# Patient Record
Sex: Female | Born: 1965 | Race: White | Hispanic: No | Marital: Married | Smoking: Former smoker
Health system: Southern US, Community
[De-identification: ages and names within clinical notes are randomized; demographics above are authoritative.]

## PROBLEM LIST (undated history)

## (undated) DIAGNOSIS — M5126 Other intervertebral disc displacement, lumbar region: Secondary | ICD-10-CM

## (undated) DIAGNOSIS — M5136 Other intervertebral disc degeneration, lumbar region: Secondary | ICD-10-CM

## (undated) DIAGNOSIS — S022XXA Fracture of nasal bones, initial encounter for closed fracture: Secondary | ICD-10-CM

## (undated) DIAGNOSIS — M545 Low back pain, unspecified: Secondary | ICD-10-CM

## (undated) DIAGNOSIS — Z974 Presence of external hearing-aid: Secondary | ICD-10-CM

## (undated) DIAGNOSIS — M26629 Arthralgia of temporomandibular joint, unspecified side: Secondary | ICD-10-CM

## (undated) DIAGNOSIS — G8929 Other chronic pain: Secondary | ICD-10-CM

## (undated) DIAGNOSIS — K5090 Crohn's disease, unspecified, without complications: Secondary | ICD-10-CM

## (undated) DIAGNOSIS — J302 Other seasonal allergic rhinitis: Secondary | ICD-10-CM

## (undated) DIAGNOSIS — M51369 Other intervertebral disc degeneration, lumbar region without mention of lumbar back pain or lower extremity pain: Secondary | ICD-10-CM

## (undated) DIAGNOSIS — H9190 Unspecified hearing loss, unspecified ear: Secondary | ICD-10-CM

## (undated) DIAGNOSIS — G44209 Tension-type headache, unspecified, not intractable: Secondary | ICD-10-CM

## (undated) DIAGNOSIS — Z98811 Dental restoration status: Secondary | ICD-10-CM

## (undated) HISTORY — PX: FINGER SURGERY: SHX640

## (undated) HISTORY — PX: STAPEDECTOMY: SHX2435

## (undated) HISTORY — PX: KNEE ARTHROSCOPY: SHX127

## (undated) HISTORY — PX: BREAST EXCISIONAL BIOPSY: SUR124

## (undated) HISTORY — PX: APPENDECTOMY: SHX54

## (undated) HISTORY — PX: ORIF FINGER FRACTURE: SHX2122

## (undated) HISTORY — PX: TONSILLECTOMY: SUR1361

## (undated) HISTORY — PX: BUNIONECTOMY: SHX129

---

## 1996-07-28 HISTORY — PX: BREAST ENHANCEMENT SURGERY: SHX7

## 2004-02-09 ENCOUNTER — Other Ambulatory Visit: Admission: RE | Admit: 2004-02-09 | Discharge: 2004-02-09 | Payer: Self-pay | Admitting: Obstetrics and Gynecology

## 2005-01-06 ENCOUNTER — Ambulatory Visit: Payer: Self-pay | Admitting: *Deleted

## 2005-01-06 ENCOUNTER — Ambulatory Visit: Payer: Self-pay | Admitting: Nurse Practitioner

## 2005-01-22 ENCOUNTER — Ambulatory Visit: Payer: Self-pay | Admitting: Nurse Practitioner

## 2005-02-12 ENCOUNTER — Ambulatory Visit: Payer: Self-pay | Admitting: Nurse Practitioner

## 2005-03-10 ENCOUNTER — Ambulatory Visit: Payer: Self-pay | Admitting: Nurse Practitioner

## 2005-04-04 ENCOUNTER — Ambulatory Visit: Payer: Self-pay | Admitting: Nurse Practitioner

## 2005-04-25 ENCOUNTER — Encounter: Admission: RE | Admit: 2005-04-25 | Discharge: 2005-07-24 | Payer: Self-pay | Admitting: Anesthesiology

## 2005-04-29 ENCOUNTER — Ambulatory Visit: Payer: Self-pay | Admitting: Anesthesiology

## 2005-04-29 ENCOUNTER — Ambulatory Visit (HOSPITAL_COMMUNITY): Admission: RE | Admit: 2005-04-29 | Discharge: 2005-04-29 | Payer: Self-pay | Admitting: Anesthesiology

## 2005-05-13 ENCOUNTER — Emergency Department (HOSPITAL_COMMUNITY): Admission: EM | Admit: 2005-05-13 | Discharge: 2005-05-13 | Payer: Self-pay | Admitting: Emergency Medicine

## 2005-05-18 ENCOUNTER — Emergency Department (HOSPITAL_COMMUNITY): Admission: EM | Admit: 2005-05-18 | Discharge: 2005-05-18 | Payer: Self-pay | Admitting: Emergency Medicine

## 2005-05-22 ENCOUNTER — Emergency Department (HOSPITAL_COMMUNITY): Admission: EM | Admit: 2005-05-22 | Discharge: 2005-05-22 | Payer: Self-pay | Admitting: Family Medicine

## 2005-05-23 ENCOUNTER — Emergency Department (HOSPITAL_COMMUNITY): Admission: EM | Admit: 2005-05-23 | Discharge: 2005-05-23 | Payer: Self-pay | Admitting: Family Medicine

## 2005-06-12 ENCOUNTER — Emergency Department (HOSPITAL_COMMUNITY): Admission: EM | Admit: 2005-06-12 | Discharge: 2005-06-12 | Payer: Self-pay | Admitting: Family Medicine

## 2005-06-24 ENCOUNTER — Encounter
Admission: RE | Admit: 2005-06-24 | Discharge: 2005-09-22 | Payer: Self-pay | Admitting: Physical Medicine and Rehabilitation

## 2005-06-24 ENCOUNTER — Ambulatory Visit: Payer: Self-pay | Admitting: Physical Medicine and Rehabilitation

## 2005-06-26 ENCOUNTER — Emergency Department (HOSPITAL_COMMUNITY): Admission: EM | Admit: 2005-06-26 | Discharge: 2005-06-26 | Payer: Self-pay | Admitting: Family Medicine

## 2005-07-22 ENCOUNTER — Emergency Department (HOSPITAL_COMMUNITY): Admission: EM | Admit: 2005-07-22 | Discharge: 2005-07-22 | Payer: Self-pay | Admitting: Family Medicine

## 2005-08-18 ENCOUNTER — Other Ambulatory Visit: Admission: RE | Admit: 2005-08-18 | Discharge: 2005-08-18 | Payer: Self-pay | Admitting: Gynecology

## 2005-08-19 ENCOUNTER — Ambulatory Visit: Payer: Self-pay | Admitting: Physical Medicine and Rehabilitation

## 2005-10-14 ENCOUNTER — Encounter
Admission: RE | Admit: 2005-10-14 | Discharge: 2006-01-12 | Payer: Self-pay | Admitting: Physical Medicine and Rehabilitation

## 2005-10-14 ENCOUNTER — Ambulatory Visit: Payer: Self-pay | Admitting: Physical Medicine and Rehabilitation

## 2005-10-23 ENCOUNTER — Encounter (INDEPENDENT_AMBULATORY_CARE_PROVIDER_SITE_OTHER): Payer: Self-pay | Admitting: Specialist

## 2005-10-23 ENCOUNTER — Inpatient Hospital Stay (HOSPITAL_COMMUNITY): Admission: RE | Admit: 2005-10-23 | Discharge: 2005-10-26 | Payer: Self-pay | Admitting: Obstetrics and Gynecology

## 2005-10-23 HISTORY — PX: ABDOMINAL HYSTERECTOMY: SHX81

## 2005-11-02 ENCOUNTER — Inpatient Hospital Stay (HOSPITAL_COMMUNITY): Admission: EM | Admit: 2005-11-02 | Discharge: 2005-11-06 | Payer: Self-pay | Admitting: Emergency Medicine

## 2005-11-06 ENCOUNTER — Encounter: Payer: Self-pay | Admitting: Obstetrics and Gynecology

## 2005-11-18 ENCOUNTER — Ambulatory Visit: Payer: Self-pay | Admitting: Physical Medicine and Rehabilitation

## 2006-02-15 ENCOUNTER — Emergency Department (HOSPITAL_COMMUNITY): Admission: EM | Admit: 2006-02-15 | Discharge: 2006-02-15 | Payer: Self-pay | Admitting: Family Medicine

## 2006-03-04 ENCOUNTER — Ambulatory Visit (HOSPITAL_COMMUNITY): Admission: RE | Admit: 2006-03-04 | Discharge: 2006-03-04 | Payer: Self-pay | Admitting: Family Medicine

## 2006-05-01 ENCOUNTER — Encounter: Admission: RE | Admit: 2006-05-01 | Discharge: 2006-05-01 | Payer: Self-pay | Admitting: Family Medicine

## 2006-07-09 ENCOUNTER — Ambulatory Visit (HOSPITAL_COMMUNITY): Admission: RE | Admit: 2006-07-09 | Discharge: 2006-07-09 | Payer: Self-pay | Admitting: Family Medicine

## 2006-07-13 ENCOUNTER — Encounter: Admission: RE | Admit: 2006-07-13 | Discharge: 2006-07-13 | Payer: Self-pay | Admitting: Gastroenterology

## 2007-08-10 ENCOUNTER — Emergency Department (HOSPITAL_COMMUNITY): Admission: EM | Admit: 2007-08-10 | Discharge: 2007-08-10 | Payer: Self-pay | Admitting: Emergency Medicine

## 2008-11-30 ENCOUNTER — Ambulatory Visit (HOSPITAL_COMMUNITY): Admission: RE | Admit: 2008-11-30 | Discharge: 2008-11-30 | Payer: Self-pay | Admitting: Family Medicine

## 2010-08-17 ENCOUNTER — Encounter: Payer: Self-pay | Admitting: Anesthesiology

## 2010-08-18 ENCOUNTER — Encounter: Payer: Self-pay | Admitting: Internal Medicine

## 2010-08-18 ENCOUNTER — Encounter: Payer: Self-pay | Admitting: Family Medicine

## 2010-08-19 ENCOUNTER — Encounter: Payer: Self-pay | Admitting: Family Medicine
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
## 2010-12-13 NOTE — Discharge Summary (Signed)
[name], [name]              ACCOUNT NO.:  [id number]   MEDICAL RECORD NO.:  [id number]          PATIENT TYPE:  INP   LOCATION:  9309                          FACILITY:  [facility]   PHYSICIAN:  Bobbye Charleston, M.D. DATE OF BIRTH:  1966/01/12   DATE OF ADMISSION:  10/23/2005  DATE OF DISCHARGE:  10/26/2005                                 DISCHARGE SUMMARY   ADMITTING DIAGNOSIS:  Severe menometrorrhagia and dysmenorrhea.   DISCHARGE DIAGNOSES:  1.  Severe menometrorrhagia and dysmenorrhea.  2.  Endometriosis.   PERTINENT PROCEDURES PERFORMED:  Total abdominal hysterectomy.   HISTORY AND PHYSICAL:  Please refer to dictated history and physical on  chart but briefly, this is a 45 year old with progressively heavier periods,  menometrorrhagia, and severe dysmenorrhea.   HOSPITAL COURSE:  The patient was taken to the operating room on October 23, 2005, for the above-named procedures and underwent them without  complication.  On postoperative day #3 the patient was eating, ambulating  and voiding, and was discharged to home with the following:  Diet was high  fiber and high water.  Activity was walk with assistance.  No driving for 2  weeks, no lifting for 6 weeks.  Increase activity slowly.  No sexual  activity for 6 weeks.  The patient may shower but no baths, and may walk up  steps.  Medications were Percocet 5 mg one p.o. q.4-6h. p.r.n. pain.  The  patient was to return to Dr. Deatra Ina on the following Monday for staples out  and return to Dr. Philis Pique in 2 weeks for incision check.      Bobbye Charleston, M.D.  Electronically Signed     MH/MEDQ  D:  12/04/2005  T:  12/05/2005  Job:  [id number]

## 2010-12-13 NOTE — Assessment & Plan Note (Signed)
[name] is a 45 year old female seen last by me on November 29.   She is being seen in our pain and rehabilitative clinic for multiple chronic  pain complaints including cervicalgia, thoracic back pain, and bilateral hip  pain.   She describes her pain as about an 8 on a scale to 10, described as  constant, sharp, and aching in nature, burning, worse at the end of the day.  She works as a Charity fundraiser.  Her sleep has been poor. Her pain improves  somewhat with rest, heat, and medication injections.  Gets good relief with  the current medications she is on. At this time, she takes oxycodone 15 mg a  couple of times a day for a total of about 100 a month.  She also takes  Klonopin.   She continues to work 40 to 50 hours a week.  She is independent with all of  her self care.  No new problems.  No problems with neurology, psychiatric  health and history area. No new problems on the Review of Systems and Health  and History form.   Past medical and family history unchanged since last visit.  She currently  lives with her mother and grandmother.   PHYSICAL EXAMINATION:  VITAL SIGNS:  Blood pressure 117/78, pulse 76,  respirations 16, 99% saturation on room air.  GENERAL: Well-developed, well-nourished female who appears her stated age.  She is oriented x3. Affect is bright and alert.  She is cooperative and  pleasant.  She stands after being seated without difficulty. Gait is normal.  She has significant limitations in cervical range of motion, not more than  10 to 15 degrees with rotation right, left, flexion or extension.  Seated  reflexes are symmetrical and intact in upper and lower extremities.  Motor  strength is good throughout, no focal weakness.  Normal tone is noted.  She  has exquisite tenderness along the iliotibial band bilaterally and in the  gluteus medius musculature.   IMPRESSION:  1.  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  2.  Lumbago secondary  to lumbar facet arthropathy.  3.  Bilateral trochanteric bursitis.  4.  Overall deconditioned with 20-pound weight gain.  5.  Mild anxiety.  6.  History of insomnia.   PLAN:  I spent approximately 30 minutes with her again assessing treatment  options.  We will go ahead and refill her medications.  She has not had any  adverse reactions from the medications, not constipated, not oversedated.  She does not use when she is driving.  Would like to get her a brace to help  her with posture, especially during activities.  Would also consider  possibly getting her involved in a physical therapy program, assistance on  lower extremity flexibility, strengthening, soft tissue pain management  techniques.  Will pursue trigger point on the next visit.  May also consider  possible injection of trochanters at some point in the future.  Do recommend  TENS unit for her.  She will make a list of medications she has been on in  the past and bring to me at the next visit.  I will see her back in a month.           ______________________________  Franchot Gallo, M.D.     DMK/MedQ  D:  08/20/2005 13:22:03  T:  08/20/2005 16:07:54  Job #:  [id number]

## 2010-12-13 NOTE — Assessment & Plan Note (Signed)
HISTORY OF PRESENT ILLNESS:  [name] evaluate her Health and History form and 14-point review  of systems.  Referred to [place] by __________ score.  I reviewed available  records.  We called for records, chaperoned visit.  We spent a considerable  period of time discussing treatment, limitations and options.  She relates  in a salted school, as well as, progressive issues with previous work in  life that has resulted in paracervical and para lumbar pain, most notably  paracervical.  Her set pain is 7/10 subjective scale, dull, aching and  throbbing.  No temporal relation of the day.  It interferes with most  activities.  Sometimes sharp and burning, extending from levator scapular.  Her para lumbar pain extends to the infragluteal region with decreased  endurance.  Sometimes tingling spasms, dizziness, and she has some  depression but states no wish to harm self or others.  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  We do not have recent __________ or MRI's, but older x-rays and  MRI's suggest that H&P, cervical spine and degenerative components of the  lower lumbar segments.   PAST SURGICAL HISTORY:  1.  Lumpectomy.  2.  Appendectomy.   SOCIAL HISTORY:  She is divorced.  She lives alone.  She is a smoker of one  half pack per day.  She denies any alcohol or drug abuse.   FAMILY HISTORY:  Remarkable for heart disease, diabetes and alcohol abuse.   MEDICATIONS:  She has been trialed on a number of medications, and I  reviewed these extensively with her, inclusive of Soma, Xanax, Dilaudid,  Buprenex and OxyContin.  She states that at this time she only currently  will have Dilaudid, Xanax and Soma available to her, and Xanax is at p.r.n.   Otherwise, 14-point review of systems and Health and History form review.  Review of systems, family and social history, otherwise,  noncontributory to  pain.   PHYSICAL EXAMINATION:  GENERAL APPEARANCE:  A pleasant female sitting  comfortably in bed.  Gait, affect, appearance is normal and oriented x3.  HEENT:  Unremarkable.  CHEST:  Clear to auscultation and percussion.  HEART:  Regular rate and rhythm without murmurs, rubs or gallops.  ABDOMEN:  Soft, nontender, benign, no hepatosplenomegaly.  Some modest  epigastric discomfort.  Query as to questioning.  Occasionally she has pain  here.  She has discussed this with primary care.  NEUROLOGICAL:  Suprascapular and levator scapular pain, extension of para lumbar position.  Decreased range of motion of the cervical and lumbar spine with side bending  positive, pain with flexion/extension cervical lumbar spine.  She has  straight leg lift impaired but EHL is fine right and left.  She is non-  distractible, Waddell's nonpositive forthright exam.   IMPRESSION:  Cervical degenerative spine disease, degenerative spine disease  lumbar spine, poor well-health characteristics, myofascial pain syndrome.   PLAN:  1.  Refresh MRI, cervical spine.  This is most problematic.  Consider      lumbar.  2.  Consider surgical assessment as I believe she probably has a surgical      neck.  We may want to try it from a minimally invasive approach      interventional procedures.  3.  I reviewed the opioid consent and Patient Care Agreement and policy and      procedure.  She  is working within the system.  Appears forthright.  Does      have legitimate medical need, and I will go ahead and prescribe      Roxycodone for symptom control.  Xanax is to be used very sparingly, and      I am going to discontinue soma.  We want to avoid a habituating profile      and treat cause as opposed to symptoms.  We will do what we can to keep      her comfortable as we enhance our database.  She will let us know if      there are any further problems.   I will see her back in two weeks.  Extensive  consultation is overall  directive care approach.  Discharge instructions given.  No barrier to  communication.           ______________________________  Rosann Auerbach, MD     HH/MedQ  D:  04/29/2005 10:36:05  T:  04/29/2005 11:27:04  Job #:  [id number]   cc:   Simeon Craft, M.D.

## 2010-12-13 NOTE — H&P (Signed)
NAMEVERLON, CARCIONE              ACCOUNT NO.:  [id number]   MEDICAL RECORD NO.:  [id number]          PATIENT TYPE:  AMB   LOCATION:  [place]                           FACILITY:  [facility]   PHYSICIAN:  Bobbye Charleston, M.D. DATE OF BIRTH:  [birth year]   DATE OF ADMISSION:  10/23/2005  DATE OF DISCHARGE:                                HISTORY & PHYSICAL   CHIEF COMPLAINT:  This is a 45 year old with progressively heavier periods,  menometrorrhagia and severe dysmenorrhea.   HISTORY OF PRESENT ILLNESS:  Ms. Elgie Congo is a 45 year old G0 who has had  periods that have been heavy and painful all her life.  However, in the last  several years her periods have become progressively worse.  She has had 3  periods per month on average.  She has severe cramps that debilitate her and  make it unable for her to go to work.  She has extremely heavy periods,  using multiple tampons and pads.  She is oftentimes in bed for 2 days  because of the periods, and has had no relief.   The patient had undergone multiple laparoscopies and had been given a  diagnosis of endometriosis in the past.  She had also attempted fertility  treatment in the past, with no success.  The patient also complains of  severe headaches during her periods.  She has been suffering for several  years, but has not had insurance and was unable to present with her  symptoms.   The patient complains of some vaginal dryness with sexual activity, but no  pain or bleeding with intercourse; no leaking of urine and no troubles with  her bowels.   PAST MEDICAL HISTORY:  The patient has a history of asthma, and is on a  metered dose inhaler (using 4x day albuterol).  I asked her to restart on  the Advair 250/50 twice a day, as well as the metered dose inhaler for  increased control of her asthma.  She has no history of diabetes, thyroid,  seizures or heart problems.   PAST SURGICAL HISTORY:  The patient has had an appendectomy.  She has had  multiple laparoscopies for dysmenorrhea and for infertility.  She has had  multiple D&Cs.   PAST GYNECOLOGIC HISTORY:  The patient does not have a history of sexually  transmitted diseases, per se.  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   PAST OBSTETRICAL HISTORY:  None.   MEDICATIONS:  The patient is no longer taking her Neurontin.  She is taking  her metered dose inhaler 4x day, and has been taking [facility] secondary to back pain.  However, she has been taking  Vicodin or Percocet in the meantime as well.  She requests Percocet  postoperatively.   ALLERGIES:  SULFA MEDICATIONS.   TOBACCO:  The patient smokes a 1/2 pack per day.   PHYSICAL EXAMINATION:  VITAL SIGNS:  Blood pressure 100/58.  GENERAL:  Reveals a well nourished female, in some distress secondary to  pain.  NECK:  Without thyromegaly or lymphadenopathy.  HEART:  Regular rate and rhythm.  PULMONARY:  Clear to auscultation.  GASTROINTESTINAL:  Her abdomen is soft, nontender and nondistended.  RECTUM:  Without masses or hemorrhoids.  BREASTS:  Symmetrical.  PELVIC:  Revealed normal external genitalia, except for a mole with  irregular borders (which was removed and found to be benign).  Normal vault  and vagina.  Cervix is round and mobile.  Uterus was less than 9 week size.  Adnexa was without masses.  She had generalized tenderness in all quadrants  of palpation (the adnexa, the uterus itself, the cul-de-sac, the anterior  etc.)  There were no other masses felt though.  EXTREMITIES:  Benign.  SKIN:  Benign.   DIAGNOSTIC TESTING:  Ultrasound revealed a 7.5 x 4.2 x 6 cm uterus, with a  1.2 cm endometrial stripe.  An endometrial biopsy was performed, which  showed no hyperplasia or malignancy.   ASSESSMENT:  This is a 45 year old with progressively worsening  dysmenorrhea, menometrorrhagia and a history of endometriosis; as well as a  history of PID.  She has had  multiple laparoscopies and has had no children.   The patient understands that the hysterectomy will make her infertile, and  she understands and accepts this.  The patient strongly desires definitive  therapy, and she will undergo a total abdominal hysterectomy with possible  bilateral salpingo-oophorectomy.  All the risks, benefits and alternatives  have been discussed with the patient, and the patient agrees to proceed.  The patient is to have preoperative antibiotics and SCDs during the surgery.  Her ovaries and tubes will be removed only if there is disease present;  otherwise, she wishes to retain them.      Bobbye Charleston, M.D.  Electronically Signed     MH/MEDQ  D:  10/23/2005  T:  10/23/2005  Job:  [id number]

## 2010-12-13 NOTE — Op Note (Signed)
[name], [name]              ACCOUNT NO.:  [id number]   MEDICAL RECORD NO.:  [id number]          PATIENT TYPE:  INP   LOCATION:  9309                          FACILITY:  [facility]   PHYSICIAN:  Bobbye Charleston, M.D. DATE OF BIRTH:  Jan 27, 1966   DATE OF PROCEDURE:  10/23/2005  DATE OF DISCHARGE:                                 OPERATIVE REPORT   PREOPERATIVE DIAGNOSIS:  Severe dysmenorrhea and menometrorrhagia.   POSTOPERATIVE DIAGNOSIS:  Severe dysmenorrhea and menometrorrhagia. Plus  endometriosis.   PROCEDURE:  Total abdominal hysterectomy.   SURGEON:  Bobbye Charleston, M.D.   ASSISTANT:  Barbaraann Rondo, M.D.   ANESTHESIA:  General.   SPECIMENS:  Uterus and cervix.   ESTIMATED BLOOD LOSS:  Less than 100 mL.   IV FLUIDS:  1100 mL.   URINE OUTPUT:  50 mL.   COMPLICATIONS:  Were none.   FINDINGS:  Generally healthy looking pelvis, normal appearing ovaries, tubes  and uterus.  There was evidence of small amounts of endometriosis plaques  less then 0.5 cm in size. One was on the uterus near the cardinal ligament  which was removed during the operation. The second was on the right ovary  which was cauterized.  The third was in the cul-de-sac and this was also  cauterized.  There was no appendix present and there were no abnormalities  otherwise.  Counts were correct x3.   MEDICATIONS:  Ancef.   TECHNIQUE:  After adequate general anesthesia was achieved, the patient  prepped, draped in sterile fashion in dorsal supine position.  A  Pfannenstiel skin was incision was made after the bladder had been emptied  with the Foley catheter.  This carried to the fascia with the Bovie cautery  and then incised in midline with the Bovie cautery.  The fascia was incised  in transverse curvilinear manner with the Mayo scissors and the muscles  reflected superiorly and inferiorly from the fascia.  The rectus muscles  were split in the midline and bowel free portion of peritoneum  entered into  carefully with sharp dissection with the Metzenbaum scissors.  The incision  was extended in superior and inferior manner with good visualization of  bowel and bladder.   The bowel was packed away with wet laps and O'Connor-O'Sullivan retractor  placed with attention to protecting the psoas muscles.  The uterus was  identified and the above findings noted.  Uterus was grasped with pair of  Kelly clamps and each round ligament was secured with a suture transfixion  stitch of 0 Vicryl and then divided with the Bovie cautery.  The  vesicouterine fascia was incised with Bovie cautery and carried in  transverse curvilinear manner.  The bladder flap was created with blunt and  sharp dissection with the Bovie cautery and Metzenbaum scissors until the  bladder was retracted out of the field.  The avascular portion of the  posterior leaf of broad ligament entered into with the Bovie cautery and  Heaneys were placed over the uterine ovarian ligaments.  Each pedicle was  incised with Mayo scissors and secured with a freehand tie of  0 Vicryl  followed by a stitch of 0 Vicryl.   The uterine arteries were skeletonized and a pair of Heaneys were placed  bilaterally on either side of the cervix.  Each pedicle was incised with  Mayo scissors and secured with a stitch of 0 Vicryl.  The cardinal ligament  was then divided using alternating successive bites of the straight Heaney  clamp.  Each pedicle was incised with scalpel and then secured with a stitch  of 0 Vicryl.  At the level of the reflection of the vagina and the cervix  pair of curved Heaneys were placed and each pedicle was incised with Mayo  scissors and the angle of the cuff then secured with a Heaney stitch of 0  Vicryl.  The uterus and cervix were then amputated with the Jorgenson  scissors and the uterus handed off the field.   The vaginal cuff was then closed with two additional figure-of-eight  stitches of 0 Vicryl.   Irrigation was performed and all hemostasis was  achieved with an additional stitch of the right uterosacral.  The ovaries  and tubes were carefully inspected and found to have no other endometriosis  except the one spot on the right-hand side.  This was cauterized with the  Bovie cautery.  The small amount of endometriosis in the cul-de-sac was then  taken care of with Bovie cautery as well.  This lesion was between the  uterosacrals and the vaginal side of the cul-de-sac.  The appendix was  looked for and it was surgically absent.  The bowel otherwise appeared  normal and so did all of the other surfaces of the pelvis.  All instruments  were then withdrawn from the abdomen after the ureters had been identified  bilaterally coursing without problems.  They were well out of the field of  dissection.  The peritoneum was then closed with a running stitch of 2-0  Vicryl.  The fascia was closed with a running stitch of 0 PDS looped.  The  subcutaneous tissue was rendered hemostatic with Bovie cautery.  The skin  was closed with staples.      Bobbye Charleston, M.D.  Electronically Signed     MH/MEDQ  D:  10/23/2005  T:  10/24/2005  Job:  [id number]

## 2010-12-13 NOTE — Discharge Summary (Signed)
[name], Snyder              ACCOUNT NO.:  [id number]   MEDICAL RECORD NO.:  [id number]          PATIENT TYPE:  INP   LOCATION:  9303                          FACILITY:  [facility]   PHYSICIAN:  Bobbye Charleston, M.D. DATE OF BIRTH:  Feb 15, 1966   DATE OF ADMISSION:  11/04/2005  DATE OF DISCHARGE:  11/06/2005                                 DISCHARGE SUMMARY   ADMISSION DIAGNOSES:  1.  Postoperative presumed cuff cellulitis with the possibility of pelvic      abscess.  2.  Superficial wound separation and seroma.  3.  Abdominal pain and fever.   HISTORY AND PHYSICAL:  Please refer to written history and physical on the  chart, but briefly this is a 45 year old, G0, status post total abdominal  hysterectomy on October 23, 2005, for severe dysmenorrhea, menorrhagia, and  endometriosis. The patient presented with abdominal pain, fever of 101, and  diarrhea. She had noted a swelling of incision, but complained of nausea and  vomiting. She has been taking Darvocet for pain which was not helpful, had  not been eating well, and had had some anorexia.   PAST MEDICAL/SURGICAL HISTORY:  As per her admission history and physical  for her hysterectomy.   MEDICATIONS:  Ibuprofen and Klonopin as well as Darvocet.   ALLERGIES:  SULFA and IODINE.   PHYSICAL EXAMINATION:  The patient's temperature on admission was 99, blood  pressure 124/75. The abdomen was significant for hypoactive bowel sounds,  but soft and without guarding. The Pfannenstiel incision had fluctuant  tenderness in a 3-4 cm area at the right apex. Pelvic exam revealed normal  external genitalia. Speculum revealed greenish foul drainage from the  vaginal apex. Bimanual exam revealed the cuff to be separated with palpable  3 cm opening and tender. There were no obvious masses.   Laboratory results revealed white blood cell count of 12.8 and 12.5 on  admission with a left shift, H&H 13/40. Other labs were otherwise normal  including a BUN and creatinine of 5 and 0.6.   A CT of the abdomen was obtained which revealed a 10-mm cyst in the right  kidney and otherwise unremarkable abdomen. Pelvic CT revealed residual area  in the pelvis greater than expected for 10 days post surgery. Room enhancing  fluid collection in the rectus sheath and subcutaneous fluid collection of  the right midline measured 2 x 3.2 x 4 cm with air, also concerning for  infection. There was also apparently free fluid within the pelvis and a more  focal room enhancing fluid collection on the right which may have  represented a large ovarian cyst.   ASSESSMENT:  Dr. Lucia Gaskins of general surgery was called to assist and consult  at bedtime and performed a small incision and drainage at the abdominal  incision finding a hematoma and seroma, which was drained and packed with  wet-to-dry dressings. Anaerobic and aerobic cultures were performed. Pelvic  fluid collection was deemed to be stable and the patient was started on two  antibiotics. The plan at that time was to admit for IV antibiotics,  gentamycin  and clindamycin; admit for pain control with Dilaudid PCA;  dressing change three times a day; CBC with diff in the a.m. follow up  cultures of wound, gonorrhea, chlamydia, and wet prep, and Dr. Lucia Gaskins  followed the patient while she was in the hospital. On hospital day #2, the  patient seemed to be responding to the antibiotics and felt better on the  Dilaudid PCA. Her appetite had returned. We continued the gentamycin and  clindamycin times 48 hours afebrile. The patient was moved from [facility] where she had been admitted through the ER to [facility] at  this point. Her white blood cell count was 12.4, but did not increase. That  evening the patient complained of increased pain with dressing change, but  had no signs of worsening of status. On hospital day #3, white blood cell  count was 10.1, the patient was afebrile  almost 48 hours. Consulted wound  care at that point to help with dressing changes. Gonorrhea and Chlamydia  had been negative. RPR had been negative and the wound cultures were still  pending. The patient again had trouble with her dressing changes, but other  than that remained stable.  On hospital day #4, the patient's IV antibiotics  were changed to Keflex q.i.d. and she was weaned from the Dilaudid PCA to  Percocet 5 mg q.4h. and Motrin q.8h.  By hospital day #5, pain control was  still a major issue, especially with the dressing changes. The patient had  been on chronic narcotics before, even before her abdominal hysterectomy.  She is currently having trouble with tolerance issues. The patient underwent  a second CT of the abdomen and pelvis showed essentially resolved rectus  sheath collection, but was worrisome for slightly increased fluid collection  of 5.3 x 5.7 x 5.6.  The ovaries could not be identified on the CT. A pelvic  ultrasound was done which showed a 5.8 x 5.1 x 4.4 simple cyst in the right  hemipelvis which was probably secondary to an ovarian cyst and ruled out an  abscess or acute hematoma. The patient remained to be afebrile and on  hospital day #5 was discharged with the following.   MEDICATIONS:  1.  Roxicodone 50 mg q.6h.  2.  Keflex 250 mg q.6h. times seven days.  3.  Colace 100 mg over-the-counter.  4.  Motrin over-the-counter.   WOUND CARE:  [facility] dressing change one time per day.   ACTIVITY:  No driving for two weeks, no lifting for six weeks, increase  activity slowly. No sexual activity for six weeks. May shower, but not bathe  and may walk up steps. The patient was to return to Dr. Philis Pique in one week  and to call for any questions.      Bobbye Charleston, M.D.  Electronically Signed     MH/MEDQ  D:  12/23/2005  T:  12/23/2005  Job:  [id number]
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
## 2010-12-13 NOTE — Assessment & Plan Note (Signed)
[name] is back in today for a recheck and refill of her medications.   She is being seen in our pain and rehabilitative clinic for multiple pain  complaints, including cervicalgia, lumbago, and bilateral lateral hip pain.   She states she recently underwent a hysterectomy on October 23, 2005, last  month.  Dr. Philis Pique is currently taking care of her postoperatively.   She states her hospital course was complicated by a wound infection.   She continues to have pain complaints, especially in her lateral hip region  and some in the left cervical region.  Her average pain is about a 7 on a  scale of 10.  She has been sleeping poorly at night.  She believes part of  this might be related to her taking some naps during the day and not  sleeping as well at night secondary to the recent pain from the surgery.   Her hip pain is typically worse when she is up walking and if she lays on  the hip.  She gets good relief with the current medications that she is on.   She is walking about 20 minutes at a time.  She is currently off of work for  another three weeks or so with her surgery.  She works as a Financial controller.   She is independent with her self-care, meal prep, shopping.   REVIEW OF SYSTEMS:  No new changes in review of systems, otherwise and as  just mentioned.   PAST MEDICAL HISTORY:  Otherwise, no other changes other than the recent  hysterectomy.   SOCIAL HISTORY:  Otherwise, no other changes.   FAMILY HISTORY:  Otherwise, no other changes.   PHYSICAL EXAMINATION:  VITAL SIGNS:  Blood pressure 109/63, pulse 76,  respirations 16, 98% saturation on room air.  GENERAL:  She is a well-developed, well-nourished female who appears her  stated age.  She is oriented x3.  Current affect is alert, cooperative, and  pleasant.  Overall bright.  MUSCULOSKELETAL:  She transitions from sit to stand with some stiffness  noted.  Her gait is stable, non-antalgic.  She has  limitations in lumbar  range of motion and cervical range.  Seated, her reflexes, however, are  symmetric and intact in the upper and lower extremities.  Motor strength is  good throughout.  She has quite a bit of tenderness over the iliotibial band  and trochanters.   IMPRESSION:  1.  Cervicalgia secondary to cervical spondylytic changes.  2.  Lumbago secondary to lumbar facet arthropathy.  3.  Bilateral trochanteric bursitis.  4.  Overall deconditioning with 20-pound weight gain.  5.  Mild anxiety.  6.  History of insomnia.   DISCUSSION:  Today, she is asking if there is a chance that she get off of  some of her narcotics.  We discussed other options to help manage her pain,  including injections, physical therapy, topical agents.  She appears to be  interested in pursuing a physical therapy program with an emphasis on  endurance work as well as strengthening.  She understands that she needs to  wait until after she is released from her gynecologic surgeon, however.  She  appears today to be motivated to reduce her narcotic load as well as  diminish the amount of benzodiazepines that she takes.  I will help her work  towards this end over the next several months.  We will need to determine  overall pain generators possibly with sacroiliac joint injection,  possible  facet injection, as well as possibly trochanteric bursitis injection, the  most emphasis being, however, on a physical therapy program.  We will  discuss this further at the next visit.  Today, we will go ahead and refill  her medications of Klonopin 1 mg one p.o. b.i.d. p.r.n., #60; Roxicodone 15  mg one-half to one tablet p.o. q.i.d., 100, no refills; Ambien 5 mg one p.o.  q.h.s. p.r.n., #15.  I will see her back in a month.           ______________________________  Franchot Gallo, M.D.     DMK/MedQ  D:  11/19/2005 13:49:12  T:  11/20/2005 11:16:38  Job #:  [id number]

## 2010-12-13 NOTE — Assessment & Plan Note (Signed)
MEDICAL RECORD NUMBER:  [id number].   [name] is a 45 year old female who was last seen by me on August 20, 2005. She is back in today for a refill of her medications. Her pain  complaints have not changed significantly. She is being seen in our pain and  rehabilitative clinic for cervicalgia, lumbago and bilateral lateral hip  pain.   Average pain is about a 7 on a scale 10 described as constant, tingling,  aching and sharp in nature. Sleep is poor. Pain is typically worse in the  morning, evening and night; better during the daytime.   Pain is exacerbated by walking, bending, sitting, standing. Improves with  heat and exercise, medication and injections. She is getting complete relief  with current medications.   She is able to climb stairs. She drives. She is able to walk 15 to 20  minutes at a time. 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   She is independent with all of her self-care. Sometimes needs assistance  with dressing and household duties.   REVIEW OF SYSTEMS:  Positive for tingling, depression, anxiety. Denies  suicidal ideation.   PAST MEDICAL, SOCIAL AND FAMILY HISTORY:  Unchanged since her last visit.  She live with her mother. She is almost completely divorced. She is  apparently waiting for some final paperwork.   PHYSICAL EXAMINATION:  VITAL SIGNS:  Blood pressure is 108/47, pulse 82,  respirations 16, 94% saturated on room air.  GENERAL:  She is a well-developed, well-nourished female who appears her  stated age. She is oriented x3. She shifts a bit in her chair while I  interview her today. Her affect, however, is bright. She is alert and  cooperative although somewhat irritable intermittently. She is able to  stand. Her gait is normal. Limitations in cervical range of motion in all  planes are noted. Some limitations in lumbar range of motion. Her balance is  good. Romberg's test is negative. Tandem gait is normal. Seated  reflexes are  symmetric and intact in the lower extremities, symmetric and intact in the  upper extremities. No abnormal tone is noted. Motor strength is good  throughout. No focal weakness is noted. She has quite a bit of tenderness  with palpation in the cervical paraspinal musculature, also tender over the  right gluteus medius especially and somewhat down the iliotibial band on the  right.   IMPRESSION:  1.  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  2.  Lumbago secondary to lumbar facet arthropathy.  3.  Bilateral trochanteric bursitis.  4.  Overall deconditioning with 20-pound weight gain.  5.  Mild anxiety.  6.  History of insomnia.   PLAN:  We discussed medications again today. She brings in a list of various  medications she has trialed in the past. She notes Vicodin caused nausea  with little or no pain relief. [name] was a great help when her muscles were  tight. Mobic was of no relief. Vioxx was of no relief. She has used Fiorinal  #2 in the past for tension headaches with mild relief, Buprenex instant  relief with severe pain. Xanax was used in the past for tension, increased  stress, help to prevent spasms and tightening of her neck and back. This was  all written on a piece of paper she brought in.   We discussed treatment options for her today. She was set up to attend  physical therapy. However, apparently, the physical  therapist facility had  attempted to contact her a couple of times without avail. The patient stated  that she was not contacted and was somewhat irritable regarding this.   We would like her to get ___________ and transition to a home exercise  program; education on postural training, TENS trial. She understands where  we would like to go with her. Also brought up possibly doing trochanteric  bursitis injection as well as cervical facet injections. She is a little  reticent regarding this currently. We will bring it up again next month.  May  consider having her followup with Dr. Valentina Shaggy for anxiety or possibly  psychiatry if he feels there is a need for further intervention. She has  been using anxiolytics intermittently for a while now. Would eventually like  to reduce her Roxicodone over the next several months if we can control her  pain with other methods. We will see her back in one month.           ______________________________  Franchot Gallo, M.D.     DMK/MedQ  D:  09/17/2005 13:26:31  T:  09/18/2005 11:00:45  Job #:  [id number]

## 2014-09-05 ENCOUNTER — Ambulatory Visit
Admission: RE | Admit: 2014-09-05 | Discharge: 2014-09-05 | Disposition: A | Discharge status: Home / Self Care | Payer: BLUE CROSS/BLUE SHIELD | Source: Ambulatory Visit | Attending: Family Medicine | Admitting: Family Medicine

## 2014-09-05 ENCOUNTER — Other Ambulatory Visit: Payer: Self-pay | Admitting: Family Medicine

## 2014-09-05 DIAGNOSIS — R52 Pain, unspecified: Secondary | ICD-10-CM

## 2015-01-12 ENCOUNTER — Other Ambulatory Visit: Payer: Self-pay

## 2015-01-12 DIAGNOSIS — D229 Melanocytic nevi, unspecified: Secondary | ICD-10-CM

## 2015-01-12 HISTORY — DX: Melanocytic nevi, unspecified: D22.9

## 2015-01-18 ENCOUNTER — Ambulatory Visit (HOSPITAL_COMMUNITY)
Admission: RE | Admit: 2015-01-18 | Discharge: 2015-01-18 | Disposition: A | Discharge status: Home / Self Care | Payer: BLUE CROSS/BLUE SHIELD | Source: Ambulatory Visit | Attending: Physician Assistant | Admitting: Physician Assistant

## 2015-01-18 ENCOUNTER — Other Ambulatory Visit (HOSPITAL_COMMUNITY): Payer: Self-pay | Admitting: Physician Assistant

## 2015-01-18 DIAGNOSIS — N289 Disorder of kidney and ureter, unspecified: Secondary | ICD-10-CM | POA: Insufficient documentation

## 2015-01-18 DIAGNOSIS — R1032 Left lower quadrant pain: Secondary | ICD-10-CM | POA: Insufficient documentation

## 2015-01-18 DIAGNOSIS — R1031 Right lower quadrant pain: Secondary | ICD-10-CM | POA: Diagnosis present

## 2015-01-18 MED ORDER — IOHEXOL 300 MG/ML  SOLN
25.0000 mL | Freq: Once | INTRAMUSCULAR | Status: AC | PRN
  Administered 2015-01-18: 100 mL via ORAL

## 2015-01-18 MED ORDER — IOHEXOL 300 MG/ML  SOLN: 25.0000 mL | INTRAMUSCULAR | Status: AC

## 2015-04-10 ENCOUNTER — Other Ambulatory Visit: Payer: Self-pay | Admitting: Obstetrics and Gynecology

## 2015-04-11 LAB — CYTOLOGY - PAP

## 2015-04-28 DIAGNOSIS — S022XXA Fracture of nasal bones, initial encounter for closed fracture: Secondary | ICD-10-CM

## 2015-04-28 HISTORY — DX: Fracture of nasal bones, initial encounter for closed fracture: S02.2XXA

## 2015-05-05 ENCOUNTER — Encounter (HOSPITAL_COMMUNITY): Payer: Self-pay | Admitting: Emergency Medicine

## 2015-05-05 ENCOUNTER — Emergency Department (HOSPITAL_COMMUNITY)
Admission: EM | Admit: 2015-05-05 | Discharge: 2015-05-05 | Disposition: A | Discharge status: Home / Self Care | Payer: BLUE CROSS/BLUE SHIELD | Attending: Emergency Medicine | Admitting: Emergency Medicine

## 2015-05-05 ENCOUNTER — Emergency Department (HOSPITAL_COMMUNITY): Payer: BLUE CROSS/BLUE SHIELD

## 2015-05-05 DIAGNOSIS — Z79899 Other long term (current) drug therapy: Secondary | ICD-10-CM | POA: Insufficient documentation

## 2015-05-05 DIAGNOSIS — S0990XA Unspecified injury of head, initial encounter: Secondary | ICD-10-CM | POA: Diagnosis not present

## 2015-05-05 DIAGNOSIS — Y9389 Activity, other specified: Secondary | ICD-10-CM | POA: Diagnosis not present

## 2015-05-05 DIAGNOSIS — W01198A Fall on same level from slipping, tripping and stumbling with subsequent striking against other object, initial encounter: Secondary | ICD-10-CM | POA: Insufficient documentation

## 2015-05-05 DIAGNOSIS — S161XXA Strain of muscle, fascia and tendon at neck level, initial encounter: Secondary | ICD-10-CM | POA: Insufficient documentation

## 2015-05-05 DIAGNOSIS — H9190 Unspecified hearing loss, unspecified ear: Secondary | ICD-10-CM | POA: Diagnosis not present

## 2015-05-05 DIAGNOSIS — W19XXXA Unspecified fall, initial encounter: Secondary | ICD-10-CM

## 2015-05-05 DIAGNOSIS — S022XXA Fracture of nasal bones, initial encounter for closed fracture: Secondary | ICD-10-CM | POA: Insufficient documentation

## 2015-05-05 DIAGNOSIS — Y9289 Other specified places as the place of occurrence of the external cause: Secondary | ICD-10-CM | POA: Insufficient documentation

## 2015-05-05 DIAGNOSIS — Z720 Tobacco use: Secondary | ICD-10-CM | POA: Insufficient documentation

## 2015-05-05 DIAGNOSIS — Y998 Other external cause status: Secondary | ICD-10-CM | POA: Insufficient documentation

## 2015-05-05 DIAGNOSIS — S0993XA Unspecified injury of face, initial encounter: Secondary | ICD-10-CM | POA: Diagnosis present

## 2015-05-05 HISTORY — DX: Other intervertebral disc degeneration, lumbar region without mention of lumbar back pain or lower extremity pain: M51.369

## 2015-05-05 HISTORY — DX: Unspecified hearing loss, unspecified ear: H91.90

## 2015-05-05 HISTORY — DX: Other intervertebral disc degeneration, lumbar region: M51.36

## 2015-05-05 MED ORDER — DIPHENHYDRAMINE HCL 25 MG PO CAPS
25.0000 mg | ORAL_CAPSULE | Freq: Once | ORAL | Status: AC
  Administered 2015-05-05: 25 mg via ORAL
  Filled 2015-05-05: qty 1

## 2015-05-05 MED ORDER — OXYCODONE-ACETAMINOPHEN 5-325 MG PO TABS: 1.0000 | ORAL_TABLET | ORAL | Status: DC | PRN

## 2015-05-05 MED ORDER — ONDANSETRON 4 MG PO TBDP
4.0000 mg | ORAL_TABLET | Freq: Once | ORAL | Status: AC
  Administered 2015-05-05: 4 mg via ORAL
  Filled 2015-05-05: qty 1

## 2015-05-05 MED ORDER — IBUPROFEN 200 MG PO TABS
600.0000 mg | ORAL_TABLET | Freq: Once | ORAL | Status: AC
  Administered 2015-05-05: 600 mg via ORAL
  Filled 2015-05-05: qty 3

## 2015-05-05 MED ORDER — HYDROMORPHONE HCL 1 MG/ML IJ SOLN
1.0000 mg | Freq: Once | INTRAMUSCULAR | Status: AC
  Administered 2015-05-05: 1 mg via INTRAMUSCULAR
  Filled 2015-05-05: qty 1

## 2015-05-05 MED ORDER — OXYCODONE-ACETAMINOPHEN 5-325 MG PO TABS
2.0000 | ORAL_TABLET | Freq: Once | ORAL | Status: AC
  Administered 2015-05-05: 2 via ORAL
  Filled 2015-05-05: qty 2

## 2015-05-05 MED ORDER — DIAZEPAM 5 MG PO TABS
5.0000 mg | ORAL_TABLET | Freq: Once | ORAL | Status: AC
  Administered 2015-05-05: 5 mg via ORAL
  Filled 2015-05-05: qty 1

## 2015-05-05 NOTE — ED Notes (Addendum)
Pt reported facial injuries s/p to slipping and falling. Noted bruising/swelling, epitaxis with controlled and rt lateral deformity to nasal. C/o headache, dizziness/lightheadedness, neck pain, no visual disturbances or LOC. Pt was seen at Urgent Care and given ice pack. Neck collar applied.

## 2015-05-05 NOTE — Discharge Instructions (Signed)
Head Injury, Adult You have a head injury. Headaches and throwing up (vomiting) are common after a head injury. It should be easy to wake up from sleeping. Sometimes you must stay in the hospital. Most problems happen within the first 24 hours. Side effects may occur up to 7-10 days after the injury.  WHAT ARE THE TYPES OF HEAD INJURIES? Head injuries can be as minor as a bump. Some head injuries can be more severe. More severe head injuries include:  A jarring injury to the brain (concussion).  A bruise of the brain (contusion). This mean there is bleeding in the brain that can cause swelling.  A cracked skull (skull fracture).  Bleeding in the brain that collects, clots, and forms a bump (hematoma). WHEN SHOULD I GET HELP RIGHT AWAY?   You are confused or sleepy.  You cannot be woken up.  You feel sick to your stomach (nauseous) or keep throwing up (vomiting).  Your dizziness or unsteadiness is getting worse.  You have very bad, lasting headaches that are not helped by medicine. Take medicines only as told by your doctor.  You cannot use your arms or legs like normal.  You cannot walk.  You notice changes in the black spots in the center of the colored part of your eye (pupil).  You have clear or bloody fluid coming from your nose or ears.  You have trouble seeing. During the next 24 hours after the injury, you must stay with someone who can watch you. This person should get help right away (call 911 in the [place] if you start to shake and are not able to control it (have seizures), you pass out, or you are unable to wake up. HOW CAN I PREVENT A HEAD INJURY IN THE FUTURE?  Wear seat belts.  Wear a helmet while bike riding and playing sports like football.  Stay away from dangerous activities around the house. WHEN CAN I RETURN TO NORMAL ACTIVITIES AND ATHLETICS? See your doctor before doing these activities. You should not do normal activities or play contact sports until 1  week after the following symptoms have stopped:  Headache that does not go away.  Dizziness.  Poor attention.  Confusion.  Memory problems.  Sickness to your stomach or throwing up.  Tiredness.  Fussiness.  Bothered by bright lights or loud noises.  Anxiousness or depression.  Restless sleep. MAKE SURE YOU:   Understand these instructions.  Will watch your condition.  Will get help right away if you are not doing well or get worse.   This information is not intended to replace advice given to you by your health care provider. Make sure you discuss any questions you have with your health care provider.   Document Released: 06/26/2008 Document Revised: 08/04/2014 Document Reviewed: 03/21/2013 Elsevier Interactive Patient Education 2016 [employer]  Nasal Fracture A fracture is a break in a bone. A nasal fracture is a broken nose. Minor breaks do not need treatment. Serious breaks may need surgery. HOME CARE  If directed, put ice on the injured area:  Put ice in a plastic bag.  Place a towel between your skin and the bag.  Leave the ice on for 20 minutes, 2-3 times per day.  Take over-the-counter and prescription medicines only as told by your doctor.  If your nose bleeds, sit up while you gently squeeze your nose shut for 10 minutes.  Try to not blow your nose.  Return to your normal activities as told  by your doctor. Ask your doctor what activities are safe for you.  Do not play contact sports for 3-4 weeks or as told by your doctor.  Keep all follow-up visits as told by your doctor. This is important. GET HELP IF:  You have more pain or very bad pain.  You keep having nosebleeds.  The shape of your nose does not return to normal after 5 days.  You have pus coming out of your nose. GET HELP RIGHT AWAY IF:  Your nose bleeds for more than 20 minutes.  You have clear fluid draining out of your nose.  You have a grape-like swelling on the inside  of your nose.  You have trouble moving your eyes.  You keep throwing up (vomiting).   This information is not intended to replace advice given to you by your health care provider. Make sure you discuss any questions you have with your health care provider.   Document Released: 04/22/2008 Document Revised: 04/04/2015 Document Reviewed: 08/21/2014 [employer]vier Interactive Patient Education [employer].

## 2015-05-05 NOTE — ED Provider Notes (Signed)
CSN: [id number]     Arrival date & time 05/05/15  1711 History   First MD Initiated Contact with Patient 05/05/15 1715     Chief Complaint  Patient presents with  . Facial Injury     (Consider location/radiation/quality/duration/timing/severity/associated sxs/prior Treatment) HPI   49 year old female with facial pain. Patient had a fall this prior to arrival. She was seated drinking a beer. As she got up she began to feel dizzy. She sits still for about a minute and began to feel better. She went to walk forward she felt dizzy again and fell to the ground. She struck her face against a concrete surface. She's not sure she had loss of consciousness or not. She complaining of primarily facial pain but also diffuse headache and neck pain. Epistaxis which has since stopped. Mild nausea. No vomiting. No blood thinners. No acute visual changes. No acute numbness, tingling or focal loss of strength. She has been ambulatory since the fall. She went to urgent care initially and then was referred to the emergency room.  Past Medical History  Diagnosis Date  . DDD (degenerative disc disease), lumbar   . Hearing loss    Past Surgical History  Procedure Laterality Date  . Abdominal hysterectomy     Family History  Problem Relation Age of Onset  . Alzheimer's disease Mother    Social History  Substance Use Topics  . Smoking status: Heavy Tobacco Smoker    Types: Cigarettes  . Smokeless tobacco: None  . Alcohol Use: 1.2 oz/week    2 Cans of beer per week   OB History    No data available     Review of Systems  All systems reviewed and negative, other than as noted in HPI.   Allergies  Sulfur  Home Medications   Prior to Admission medications   Medication Sig Start Date End Date Taking? Authorizing Provider  ALPRAZolam Duanne Moron) 0.25 MG tablet Take 0.25 mg by mouth daily as needed for anxiety.   Yes Historical Provider, MD  Ascorbic Acid (VITAMIN C PO) Take 1 tablet by mouth daily.    Yes Historical Provider, MD  Cholecalciferol (VITAMIN D) 2000 UNITS tablet Take 2,000 Units by mouth 2 (two) times daily.   Yes Historical Provider, MD  glucosamine-chondroitin 500-400 MG tablet Take 2 tablets by mouth at bedtime. With Tumeric and magnesium   Yes Historical Provider, MD  HYDROcodone-acetaminophen (NORCO/VICODIN) 5-325 MG tablet Take 1 tablet by mouth every 4 (four) hours as needed for moderate pain.   Yes Historical Provider, MD  loratadine (CLARITIN) 10 MG tablet Take 10 mg by mouth daily.   Yes Historical Provider, MD  Omega-3 Fatty Acids (FISH OIL) 1000 MG CAPS Take 2 capsules by mouth daily.   Yes Historical Provider, MD  predniSONE (DELTASONE) 10 MG tablet Take 40 mg by mouth daily.   Yes Historical Provider, MD  Soybean Protein (GLYCINE SOYA PROTEIN) SOLN Take 2 g by mouth at bedtime.   Yes Historical Provider, MD   BP 136/76 mmHg  Pulse 90  Temp(Src) 98.5 F (36.9 C) (Oral)  Resp 18  Ht 5' 8"  (1.727 m)  Wt 151 lb (68.493 kg)  BMI 22.96 kg/m2  SpO2 100% Physical Exam  Constitutional: She is oriented to person, place, and time. She appears well-developed and well-nourished. No distress.  HENT:  Swelling/deformity to the nose. Small, nonbleeding abrasions to the bridge of the nose. Dried blood bilateral nares. No active bleeding noted. Oropharynx is clear.  Eyes: Conjunctivae are  normal. Right eye exhibits no discharge. Left eye exhibits no discharge.  Neck: Neck supple.  Cardiovascular: Normal rate, regular rhythm and normal heart sounds.  Exam reveals no gallop and no friction rub.   No murmur heard. Pulmonary/Chest: Effort normal and breath sounds normal. No respiratory distress.  Abdominal: Soft. She exhibits no distension. There is no tenderness.  Musculoskeletal: She exhibits no edema or tenderness.  Neurological: She is alert and oriented to person, place, and time. No cranial nerve deficit. She exhibits normal muscle tone. Coordination normal.  Speech  clear. Content appropriate. Follows commands. Cranial nerves II through XII are intact. Strength is 5 out of 5 bilateral upper lower extremities. Good finger to nose testing bilaterally. Walked with patient to the bathroom. She was somewhat unsteady, but did not require assistance. She was complaining of feeling a little bit "woozy" as she walking.  Skin: Skin is warm and dry.  Psychiatric: She has a normal mood and affect. Her behavior is normal. Thought content normal.  Nursing note and vitals reviewed.   ED Course  Procedures (including critical care time) Labs Review Labs Reviewed - No data to display  Imaging Review Ct Head Wo Contrast  05/05/2015   CLINICAL DATA:  Status post slip and fall, with facial injuries. Bruising and swelling about the face, with epistaxis and right-sided nasal deformity. Headache, dizziness and lightheadedness. Neck pain. Initial encounter.  EXAM: CT HEAD WITHOUT CONTRAST  CT MAXILLOFACIAL WITHOUT CONTRAST  CT CERVICAL SPINE WITHOUT CONTRAST  TECHNIQUE: Multidetector CT imaging of the head, cervical spine, and maxillofacial structures were performed using the standard protocol without intravenous contrast. Multiplanar CT image reconstructions of the cervical spine and maxillofacial structures were also generated.  COMPARISON:  MRI of the cervical spine performed 04/29/2005  FINDINGS: CT HEAD FINDINGS  There is no evidence of acute infarction, mass lesion, or intra- or extra-axial hemorrhage on CT.  The posterior fossa, including the cerebellum, brainstem and fourth ventricle, is within normal limits. The third and lateral ventricles, and basal ganglia are unremarkable in appearance. The cerebral hemispheres are symmetric in appearance, with normal gray-white differentiation. No mass effect or midline shift is seen.  There is a minimally depressed fracture involving the right side of the nasal bone, with mild overlying soft tissue air and soft tissue swelling. The orbits  are within normal limits. There is mild partial opacification of the left maxillary sinus. The remaining paranasal sinuses and mastoid air cells are well-aerated.  CT MAXILLOFACIAL FINDINGS  There is a minimally depressed fracture involving the right side of the nasal bone, with mild overlying soft tissue air and soft tissue swelling. There is question of a nondisplaced fracture through the lateral wall of the left maxillary sinus, with mild partial opacification of the left maxillary sinus.  The mandible appears intact. The visualized dentition demonstrates no acute abnormality.  The orbits are intact bilaterally. The visualized paranasal sinuses and mastoid air cells are well-aerated.  No significant soft tissue abnormalities are seen. The parapharyngeal fat planes are preserved. The nasopharynx, oropharynx and hypopharynx are unremarkable in appearance. The visualized portions of the valleculae and piriform sinuses are grossly unremarkable.  The parotid and submandibular glands are within normal limits. No cervical lymphadenopathy is seen.  CT CERVICAL SPINE FINDINGS  There is no evidence of fracture or subluxation. Vertebral bodies demonstrate normal height and alignment. There is mild intervertebral disc space narrowing at C4-C5, with small posterior disc osteophyte complexes along the mid cervical spine. Prevertebral soft tissues are within  normal limits. Minimal degenerative change is noted at the temporomandibular joints bilaterally.  The thyroid gland is unremarkable in appearance. The visualized lung apices are clear. No significant soft tissue abnormalities are seen.  IMPRESSION: 1. No evidence of traumatic intracranial injury. 2. No evidence of fracture or subluxation along the cervical spine. 3. Minimally depressed fracture involving the right side of the nasal bone, with mild overlying soft tissue air and soft tissue swelling. 4. Question of nondisplaced fracture through the lateral wall of the left  maxillary sinus, with mild partial opacification of the left maxillary sinus. 5. Minimal degenerative change along the mid cervical spine. 6. Minimal degenerative change at the temporomandibular joints bilaterally.   Electronically Signed   By: Garald Balding M.D.   On: 05/05/2015 18:57   Ct Cervical Spine Wo Contrast  05/05/2015   CLINICAL DATA:  Status post slip and fall, with facial injuries. Bruising and swelling about the face, with epistaxis and right-sided nasal deformity. Headache, dizziness and lightheadedness. Neck pain. Initial encounter.  EXAM: CT HEAD WITHOUT CONTRAST  CT MAXILLOFACIAL WITHOUT CONTRAST  CT CERVICAL SPINE WITHOUT CONTRAST  TECHNIQUE: Multidetector CT imaging of the head, cervical spine, and maxillofacial structures were performed using the standard protocol without intravenous contrast. Multiplanar CT image reconstructions of the cervical spine and maxillofacial structures were also generated.  COMPARISON:  MRI of the cervical spine performed 04/29/2005  FINDINGS: CT HEAD FINDINGS  There is no evidence of acute infarction, mass lesion, or intra- or extra-axial hemorrhage on CT.  The posterior fossa, including the cerebellum, brainstem and fourth ventricle, is within normal limits. The third and lateral ventricles, and basal ganglia are unremarkable in appearance. The cerebral hemispheres are symmetric in appearance, with normal gray-white differentiation. No mass effect or midline shift is seen.  There is a minimally depressed fracture involving the right side of the nasal bone, with mild overlying soft tissue air and soft tissue swelling. The orbits are within normal limits. There is mild partial opacification of the left maxillary sinus. The remaining paranasal sinuses and mastoid air cells are well-aerated.  CT MAXILLOFACIAL FINDINGS  There is a minimally depressed fracture involving the right side of the nasal bone, with mild overlying soft tissue air and soft tissue swelling.  There is question of a nondisplaced fracture through the lateral wall of the left maxillary sinus, with mild partial opacification of the left maxillary sinus.  The mandible appears intact. The visualized dentition demonstrates no acute abnormality.  The orbits are intact bilaterally. The visualized paranasal sinuses and mastoid air cells are well-aerated.  No significant soft tissue abnormalities are seen. The parapharyngeal fat planes are preserved. The nasopharynx, oropharynx and hypopharynx are unremarkable in appearance. The visualized portions of the valleculae and piriform sinuses are grossly unremarkable.  The parotid and submandibular glands are within normal limits. No cervical lymphadenopathy is seen.  CT CERVICAL SPINE FINDINGS  There is no evidence of fracture or subluxation. Vertebral bodies demonstrate normal height and alignment. There is mild intervertebral disc space narrowing at C4-C5, with small posterior disc osteophyte complexes along the mid cervical spine. Prevertebral soft tissues are within normal limits. Minimal degenerative change is noted at the temporomandibular joints bilaterally.  The thyroid gland is unremarkable in appearance. The visualized lung apices are clear. No significant soft tissue abnormalities are seen.  IMPRESSION: 1. No evidence of traumatic intracranial injury. 2. No evidence of fracture or subluxation along the cervical spine. 3. Minimally depressed fracture involving the right side of the nasal  bone, with mild overlying soft tissue air and soft tissue swelling. 4. Question of nondisplaced fracture through the lateral wall of the left maxillary sinus, with mild partial opacification of the left maxillary sinus. 5. Minimal degenerative change along the mid cervical spine. 6. Minimal degenerative change at the temporomandibular joints bilaterally.   Electronically Signed   By: Garald Balding M.D.   On: 05/05/2015 18:57   Ct Maxillofacial Wo Cm  05/05/2015   CLINICAL  DATA:  Status post slip and fall, with facial injuries. Bruising and swelling about the face, with epistaxis and right-sided nasal deformity. Headache, dizziness and lightheadedness. Neck pain. Initial encounter.  EXAM: CT HEAD WITHOUT CONTRAST  CT MAXILLOFACIAL WITHOUT CONTRAST  CT CERVICAL SPINE WITHOUT CONTRAST  TECHNIQUE: Multidetector CT imaging of the head, cervical spine, and maxillofacial structures were performed using the standard protocol without intravenous contrast. Multiplanar CT image reconstructions of the cervical spine and maxillofacial structures were also generated.  COMPARISON:  MRI of the cervical spine performed 04/29/2005  FINDINGS: CT HEAD FINDINGS  There is no evidence of acute infarction, mass lesion, or intra- or extra-axial hemorrhage on CT.  The posterior fossa, including the cerebellum, brainstem and fourth ventricle, is within normal limits. The third and lateral ventricles, and basal ganglia are unremarkable in appearance. The cerebral hemispheres are symmetric in appearance, with normal gray-white differentiation. No mass effect or midline shift is seen.  There is a minimally depressed fracture involving the right side of the nasal bone, with mild overlying soft tissue air and soft tissue swelling. The orbits are within normal limits. There is mild partial opacification of the left maxillary sinus. The remaining paranasal sinuses and mastoid air cells are well-aerated.  CT MAXILLOFACIAL FINDINGS  There is a minimally depressed fracture involving the right side of the nasal bone, with mild overlying soft tissue air and soft tissue swelling. There is question of a nondisplaced fracture through the lateral wall of the left maxillary sinus, with mild partial opacification of the left maxillary sinus.  The mandible appears intact. The visualized dentition demonstrates no acute abnormality.  The orbits are intact bilaterally. The visualized paranasal sinuses and mastoid air cells are  well-aerated.  No significant soft tissue abnormalities are seen. The parapharyngeal fat planes are preserved. The nasopharynx, oropharynx and hypopharynx are unremarkable in appearance. The visualized portions of the valleculae and piriform sinuses are grossly unremarkable.  The parotid and submandibular glands are within normal limits. No cervical lymphadenopathy is seen.  CT CERVICAL SPINE FINDINGS  There is no evidence of fracture or subluxation. Vertebral bodies demonstrate normal height and alignment. There is mild intervertebral disc space narrowing at C4-C5, with small posterior disc osteophyte complexes along the mid cervical spine. Prevertebral soft tissues are within normal limits. Minimal degenerative change is noted at the temporomandibular joints bilaterally.  The thyroid gland is unremarkable in appearance. The visualized lung apices are clear. No significant soft tissue abnormalities are seen.  IMPRESSION: 1. No evidence of traumatic intracranial injury. 2. No evidence of fracture or subluxation along the cervical spine. 3. Minimally depressed fracture involving the right side of the nasal bone, with mild overlying soft tissue air and soft tissue swelling. 4. Question of nondisplaced fracture through the lateral wall of the left maxillary sinus, with mild partial opacification of the left maxillary sinus. 5. Minimal degenerative change along the mid cervical spine. 6. Minimal degenerative change at the temporomandibular joints bilaterally.   Electronically Signed   By: Francoise Schaumann.D.  On: 05/05/2015 18:57   I have personally reviewed and evaluated these images and lab results as part of my medical decision-making.   EKG Interpretation None      MDM   Final diagnoses:  Fall, initial encounter  Nasal fracture, closed, initial encounter  Cervical strain, initial encounter  Closed head injury, initial encounter    49 year old female with headache and facial trauma after a fall. No  blood thinners. He dynamically stable. Nonfocal neurological examination. Clinically, likely nasal fracture. Will image her head, cervical spine and facial bones. Pain medication.    Virgel Manifold, MD 05/06/15 [phone]

## 2015-05-09 ENCOUNTER — Other Ambulatory Visit: Payer: Self-pay | Admitting: Otolaryngology

## 2015-05-10 ENCOUNTER — Encounter (HOSPITAL_BASED_OUTPATIENT_CLINIC_OR_DEPARTMENT_OTHER): Payer: Self-pay | Admitting: *Deleted

## 2015-05-14 ENCOUNTER — Ambulatory Visit (HOSPITAL_BASED_OUTPATIENT_CLINIC_OR_DEPARTMENT_OTHER): Payer: BLUE CROSS/BLUE SHIELD | Admitting: Anesthesiology

## 2015-05-14 ENCOUNTER — Ambulatory Visit (HOSPITAL_BASED_OUTPATIENT_CLINIC_OR_DEPARTMENT_OTHER)
Admission: RE | Admit: 2015-05-14 | Discharge: 2015-05-14 | Disposition: A | Discharge status: Home / Self Care | Payer: BLUE CROSS/BLUE SHIELD | Source: Ambulatory Visit | Attending: Otolaryngology | Admitting: Otolaryngology

## 2015-05-14 ENCOUNTER — Encounter (HOSPITAL_BASED_OUTPATIENT_CLINIC_OR_DEPARTMENT_OTHER)
Admission: RE | Disposition: A | Discharge status: Home / Self Care | Payer: Self-pay | Source: Ambulatory Visit | Attending: Otolaryngology

## 2015-05-14 ENCOUNTER — Encounter (HOSPITAL_BASED_OUTPATIENT_CLINIC_OR_DEPARTMENT_OTHER): Payer: Self-pay | Admitting: *Deleted

## 2015-05-14 DIAGNOSIS — F419 Anxiety disorder, unspecified: Secondary | ICD-10-CM | POA: Insufficient documentation

## 2015-05-14 DIAGNOSIS — Y9289 Other specified places as the place of occurrence of the external cause: Secondary | ICD-10-CM | POA: Diagnosis not present

## 2015-05-14 DIAGNOSIS — Z882 Allergy status to sulfonamides status: Secondary | ICD-10-CM | POA: Diagnosis not present

## 2015-05-14 DIAGNOSIS — R51 Headache: Secondary | ICD-10-CM | POA: Diagnosis not present

## 2015-05-14 DIAGNOSIS — Y9389 Activity, other specified: Secondary | ICD-10-CM | POA: Insufficient documentation

## 2015-05-14 DIAGNOSIS — M542 Cervicalgia: Secondary | ICD-10-CM | POA: Insufficient documentation

## 2015-05-14 DIAGNOSIS — S022XXA Fracture of nasal bones, initial encounter for closed fracture: Secondary | ICD-10-CM | POA: Diagnosis present

## 2015-05-14 DIAGNOSIS — F17210 Nicotine dependence, cigarettes, uncomplicated: Secondary | ICD-10-CM | POA: Diagnosis not present

## 2015-05-14 DIAGNOSIS — W19XXXA Unspecified fall, initial encounter: Secondary | ICD-10-CM | POA: Insufficient documentation

## 2015-05-14 DIAGNOSIS — M1990 Unspecified osteoarthritis, unspecified site: Secondary | ICD-10-CM | POA: Diagnosis not present

## 2015-05-14 DIAGNOSIS — Y998 Other external cause status: Secondary | ICD-10-CM | POA: Diagnosis not present

## 2015-05-14 HISTORY — DX: Presence of external hearing-aid: Z97.4

## 2015-05-14 HISTORY — DX: Arthralgia of temporomandibular joint, unspecified side: M26.629

## 2015-05-14 HISTORY — DX: Low back pain, unspecified: M54.50

## 2015-05-14 HISTORY — DX: Crohn's disease, unspecified, without complications: K50.90

## 2015-05-14 HISTORY — DX: Tension-type headache, unspecified, not intractable: G44.209

## 2015-05-14 HISTORY — DX: Other seasonal allergic rhinitis: J30.2

## 2015-05-14 HISTORY — DX: Other intervertebral disc displacement, lumbar region: M51.26

## 2015-05-14 HISTORY — DX: Dental restoration status: Z98.811

## 2015-05-14 HISTORY — PX: CLOSED REDUCTION NASAL FRACTURE: SHX5365

## 2015-05-14 HISTORY — DX: Other intervertebral disc degeneration, lumbar region: M51.36

## 2015-05-14 HISTORY — DX: Low back pain: M54.5

## 2015-05-14 HISTORY — DX: Other intervertebral disc degeneration, lumbar region without mention of lumbar back pain or lower extremity pain: M51.369

## 2015-05-14 HISTORY — DX: Fracture of nasal bones, initial encounter for closed fracture: S02.2XXA

## 2015-05-14 HISTORY — DX: Other chronic pain: G89.29

## 2015-05-14 SURGERY — CLOSED REDUCTION, FRACTURE, NASAL BONE
Anesthesia: General | Site: Nose

## 2015-05-14 MED ORDER — ONDANSETRON HCL 4 MG/2ML IJ SOLN
INTRAMUSCULAR | Status: AC
  Filled 2015-05-14: qty 2

## 2015-05-14 MED ORDER — LIDOCAINE HCL (CARDIAC) 20 MG/ML IV SOLN
INTRAVENOUS | Status: DC | PRN
  Administered 2015-05-14: 50 mg via INTRAVENOUS

## 2015-05-14 MED ORDER — LIDOCAINE HCL (CARDIAC) 20 MG/ML IV SOLN
INTRAVENOUS | Status: AC
  Filled 2015-05-14: qty 5

## 2015-05-14 MED ORDER — OXYMETAZOLINE HCL 0.05 % NA SOLN
NASAL | Status: AC
  Filled 2015-05-14: qty 15

## 2015-05-14 MED ORDER — SCOPOLAMINE 1 MG/3DAYS TD PT72: 1.0000 | MEDICATED_PATCH | Freq: Once | TRANSDERMAL | Status: DC | PRN

## 2015-05-14 MED ORDER — PROPOFOL 10 MG/ML IV BOLUS
INTRAVENOUS | Status: DC | PRN
  Administered 2015-05-14: 200 mg via INTRAVENOUS

## 2015-05-14 MED ORDER — OXYMETAZOLINE HCL 0.05 % NA SOLN
NASAL | Status: DC | PRN
  Administered 2015-05-14: 1 via TOPICAL

## 2015-05-14 MED ORDER — MIDAZOLAM HCL 2 MG/2ML IJ SOLN
1.0000 mg | INTRAMUSCULAR | Status: DC | PRN
  Administered 2015-05-14: 2 mg via INTRAVENOUS

## 2015-05-14 MED ORDER — LIDOCAINE-EPINEPHRINE 1 %-1:100000 IJ SOLN
INTRAMUSCULAR | Status: AC
  Filled 2015-05-14: qty 1

## 2015-05-14 MED ORDER — HYDROMORPHONE HCL 1 MG/ML IJ SOLN
INTRAMUSCULAR | Status: AC
  Filled 2015-05-14: qty 1

## 2015-05-14 MED ORDER — OXYCODONE HCL 5 MG PO TABS
5.0000 mg | ORAL_TABLET | Freq: Once | ORAL | Status: AC | PRN
  Administered 2015-05-14: 5 mg via ORAL

## 2015-05-14 MED ORDER — GLYCOPYRROLATE 0.2 MG/ML IJ SOLN: 0.2000 mg | Freq: Once | INTRAMUSCULAR | Status: DC | PRN

## 2015-05-14 MED ORDER — OXYCODONE HCL 5 MG PO TABS
ORAL_TABLET | ORAL | Status: AC
  Filled 2015-05-14: qty 1

## 2015-05-14 MED ORDER — PROPOFOL 10 MG/ML IV BOLUS
INTRAVENOUS | Status: AC
  Filled 2015-05-14: qty 20

## 2015-05-14 MED ORDER — DEXAMETHASONE SODIUM PHOSPHATE 10 MG/ML IJ SOLN
INTRAMUSCULAR | Status: AC
  Filled 2015-05-14: qty 1

## 2015-05-14 MED ORDER — FENTANYL CITRATE (PF) 100 MCG/2ML IJ SOLN
50.0000 ug | INTRAMUSCULAR | Status: DC | PRN
Start: 2015-05-14 — End: 2015-05-14
  Administered 2015-05-14 (×2): 50 ug via INTRAVENOUS

## 2015-05-14 MED ORDER — DEXAMETHASONE SODIUM PHOSPHATE 4 MG/ML IJ SOLN
INTRAMUSCULAR | Status: DC | PRN
  Administered 2015-05-14: 10 mg via INTRAVENOUS

## 2015-05-14 MED ORDER — HYDROMORPHONE HCL 1 MG/ML IJ SOLN
0.2500 mg | INTRAMUSCULAR | Status: DC | PRN
  Administered 2015-05-14 (×4): 0.5 mg via INTRAVENOUS

## 2015-05-14 MED ORDER — BACITRACIN ZINC 500 UNIT/GM EX OINT
TOPICAL_OINTMENT | CUTANEOUS | Status: AC
  Filled 2015-05-14: qty 28.35

## 2015-05-14 MED ORDER — PROMETHAZINE HCL 25 MG/ML IJ SOLN: 6.2500 mg | INTRAMUSCULAR | Status: DC | PRN

## 2015-05-14 MED ORDER — OXYCODONE-ACETAMINOPHEN 5-325 MG PO TABS: 1.0000 | ORAL_TABLET | Freq: Four times a day (QID) | ORAL | Status: DC | PRN

## 2015-05-14 MED ORDER — FENTANYL CITRATE (PF) 100 MCG/2ML IJ SOLN
INTRAMUSCULAR | Status: AC
  Filled 2015-05-14: qty 4

## 2015-05-14 MED ORDER — OXYCODONE HCL 5 MG/5ML PO SOLN: 5.0000 mg | Freq: Once | ORAL | Status: AC | PRN

## 2015-05-14 MED ORDER — ONDANSETRON HCL 4 MG/2ML IJ SOLN
INTRAMUSCULAR | Status: DC | PRN
  Administered 2015-05-14: 4 mg via INTRAVENOUS

## 2015-05-14 MED ORDER — LACTATED RINGERS IV SOLN
INTRAVENOUS | Status: DC
  Administered 2015-05-14 (×2): via INTRAVENOUS

## 2015-05-14 MED ORDER — MIDAZOLAM HCL 2 MG/2ML IJ SOLN
INTRAMUSCULAR | Status: AC
  Filled 2015-05-14: qty 4

## 2015-05-14 SURGICAL SUPPLY — 31 items
BENZOIN TINCTURE PRP APPL 2/3 (GAUZE/BANDAGES/DRESSINGS) ×3 IMPLANT
CANISTER SUCT 1200ML W/VALVE (MISCELLANEOUS) ×3 IMPLANT
CONT SPEC 4OZ CLIKSEAL STRL BL (MISCELLANEOUS) ×3 IMPLANT
DECANTER SPIKE VIAL GLASS SM (MISCELLANEOUS) IMPLANT
DRESSING ADAPTIC 1/2  N-ADH (PACKING) IMPLANT
DRESSING NASAL KENNEDY 3.5X.9 (MISCELLANEOUS) IMPLANT
DRSG NASAL KENNEDY 3.5X.9 (MISCELLANEOUS)
DRSG TELFA 3X8 NADH (GAUZE/BANDAGES/DRESSINGS) IMPLANT
GLOVE BIO SURGEON STRL SZ7.5 (GLOVE) IMPLANT
GLOVE SURG SS PI 7.0 STRL IVOR (GLOVE) ×3 IMPLANT
KIT SPLINT NASAL DENVER LRG BE (GAUZE/BANDAGES/DRESSINGS) IMPLANT
KIT SPLINT NASAL DENVER MIN BE (GAUZE/BANDAGES/DRESSINGS) IMPLANT
KIT SPLINT NASAL DENVER PET BE (GAUZE/BANDAGES/DRESSINGS) IMPLANT
KIT SPLINT NASAL DENVER SM BEI (GAUZE/BANDAGES/DRESSINGS) ×3 IMPLANT
MARKER SKIN DUAL TIP RULER LAB (MISCELLANEOUS) IMPLANT
NEEDLE PRECISIONGLIDE 27X1.5 (NEEDLE) IMPLANT
NS IRRIG 1000ML POUR BTL (IV SOLUTION) IMPLANT
SHEET MEDIUM DRAPE 40X70 STRL (DRAPES) ×3 IMPLANT
SPONGE GAUZE 2X2 8PLY STER LF (GAUZE/BANDAGES/DRESSINGS) ×1
SPONGE GAUZE 2X2 8PLY STRL LF (GAUZE/BANDAGES/DRESSINGS) ×2 IMPLANT
SPONGE GAUZE 4X4 12PLY STER LF (GAUZE/BANDAGES/DRESSINGS) ×3 IMPLANT
SPONGE NEURO XRAY DETECT 1X3 (DISPOSABLE) ×3 IMPLANT
STRIP SUTURE WOUND CLOSURE 1/2 (SUTURE) IMPLANT
SUT ETHILON 3 0 PS 1 (SUTURE) IMPLANT
SYR CONTROL 10ML LL (SYRINGE) IMPLANT
TAPE PAPER 1/2X10 TAN MEDIPORE (MISCELLANEOUS) ×3 IMPLANT
TOWEL OR 17X24 6PK STRL BLUE (TOWEL DISPOSABLE) ×3 IMPLANT
TUBE CONNECTING 20'X1/4 (TUBING) ×1
TUBE CONNECTING 20X1/4 (TUBING) ×2 IMPLANT
TUBE SALEM SUMP 12R W/ARV (TUBING) IMPLANT
YANKAUER SUCT BULB TIP NO VENT (SUCTIONS) IMPLANT

## 2015-05-14 NOTE — Anesthesia Preprocedure Evaluation (Addendum)
Anesthesia Evaluation  Patient identified by MRN, date of birth, ID band Patient awake    Reviewed: Allergy & Precautions, NPO status , Patient's Chart, lab work & pertinent test results  Airway Mallampati: II  TM Distance: >3 FB Neck ROM: Full    Dental   Pulmonary Current Smoker,    breath sounds clear to auscultation       Cardiovascular negative cardio ROS   Rhythm:Regular Rate:Normal     Neuro/Psych  Headaches,    GI/Hepatic negative GI ROS, Neg liver ROS,   Endo/Other  negative endocrine ROS  Renal/GU negative Renal ROS     Musculoskeletal  (+) Arthritis ,   Abdominal   Peds  Hematology negative hematology ROS (+)   Anesthesia Other Findings   Reproductive/Obstetrics                            Anesthesia Physical Anesthesia Plan  ASA: II  Anesthesia Plan: General   Post-op Pain Management:    Induction: Intravenous  Airway Management Planned: LMA  Additional Equipment:   Intra-op Plan:   Post-operative Plan:   Informed Consent: I have reviewed the patients History and Physical, chart, labs and discussed the procedure including the risks, benefits and alternatives for the proposed anesthesia with the patient or authorized representative who has indicated his/her understanding and acceptance.     Plan Discussed with: CRNA  Anesthesia Plan Comments:        Anesthesia Quick Evaluation

## 2015-05-14 NOTE — Anesthesia Postprocedure Evaluation (Signed)
  Anesthesia Post-op Note  Patient: [name]  Procedure(s) Performed: Procedure(s): CLOSED REDUCTION NASAL FRACTURE (N/A)  Patient Location: PACU  Anesthesia Type:General  Level of Consciousness: awake and alert   Airway and Oxygen Therapy: Patient Spontanous Breathing  Post-op Pain: none  Post-op Assessment: Post-op Vital signs reviewed              Post-op Vital Signs: Reviewed  Last Vitals:  Filed Vitals:   05/14/15 1230  BP: 136/81  Pulse: 77  Temp: 36.7 C  Resp: 18    Complications: No apparent anesthesia complications

## 2015-05-14 NOTE — Anesthesia Procedure Notes (Signed)
Procedure Name: LMA Insertion Date/Time: 05/14/2015 10:27 AM Performed by: Lieutenant Diego Pre-anesthesia Checklist: Patient identified, Emergency Drugs available, Suction available and Patient being monitored Patient Re-evaluated:Patient Re-evaluated prior to inductionOxygen Delivery Method: Circle System Utilized Preoxygenation: Pre-oxygenation with 100% oxygen Intubation Type: IV induction Ventilation: Mask ventilation without difficulty LMA: LMA inserted LMA Size: 4.0 Number of attempts: 1 Airway Equipment and Method: Bite block Placement Confirmation: positive ETCO2 and breath sounds checked- equal and bilateral Tube secured with: Tape Dental Injury: Teeth and Oropharynx as per pre-operative assessment

## 2015-05-14 NOTE — Op Note (Signed)
DATE OF PROCEDURE: 05/14/2015  OPERATIVE REPORT   SURGEON: Leta Baptist, MD  PREOPERATIVE DIAGNOSIS: Depressed right nasal bone fracture  POSTOPERATIVE DIAGNOSIS:Depressed right nasal bone fracture  PROCEDURES PERFORMED: Closed reduction of nasal bone fracture.  ANESTHESIA: General laryngeal mask anesthesia.  COMPLICATIONS: None.  ESTIMATED BLOOD LOSS: Minimal.  INDICATION FOR PROCEDURE:  ELLOWYN RIEVES is a 49 y.o. female who recently had an accidental fall. It resulted in significant facial trauma. She was evaluated at the [facility] emergency room. Her facial CT showed a mildly depressed right nasal bone fracture. The patient would like to have the fracture reduced. Based on the above findings, the decision was made for the patient to undergo the above-stated procedures. The risks, benefits, alternatives, and details of the procedures were discussed with the mother. Questions were invited and answered. Informed consent was obtained.  DESCRIPTION OF PROCEDURE: The patient was taken to the operating room and placed supine on the operating table. General laryngeal mask anesthesia was induced by the anesthesiologist. Pledgets soaked with Afrin were placed in both nasal cavities for vasoconstriction. The pledgets were subsequently removed. Examination of the nose showed a mildly depressed right nasal bone fracture. Using a nasal bone elevator, the depressed fragment was carefully reduced. Hemostasis was achieved with pledgets soaked with Afrin. A [place] splint was applied to the nasal dorsum.  The care of the patient was turned over to the anesthesiologist. The patient was awakened from anesthesia without difficulty. She was extubated and transferred to the recovery room in good condition.  OPERATIVE FINDINGS: Mildly depressed right nasal bone fracture.  SPECIMEN: None.  FOLLOWUP CARE: The patient will be discharged home once she is awake and alert. She will  follow up in my office in 1 week.

## 2015-05-14 NOTE — Discharge Instructions (Addendum)
The patient may resume all her previous activities and diet. She will follow-up in my office in one week.   Call your surgeon if you experience:   1.  Fever over 101.0. 2.  Inability to urinate. 3.  Nausea and/or vomiting. 4.  Extreme swelling or bruising at the surgical site. 5.  Continued bleeding from the incision. 6.  Increased pain, redness or drainage from the incision. 7.  Problems related to your pain medication. 8. Any change in color, movement and/or sensation 9. Any problems and/or concerns     Post Anesthesia Home Care Instructions  Activity: Get plenty of rest for the remainder of the day. A responsible adult should stay with you for 24 hours following the procedure.  For the next 24 hours, DO NOT: -Drive a car -Paediatric nurse -Drink alcoholic beverages -Take any medication unless instructed by your physician -Make any legal decisions or sign important papers.  Meals: Start with liquid foods such as gelatin or soup. Progress to regular foods as tolerated. Avoid greasy, spicy, heavy foods. If nausea and/or vomiting occur, drink only clear liquids until the nausea and/or vomiting subsides. Call your physician if vomiting continues.  Special Instructions/Symptoms: Your throat may feel dry or sore from the anesthesia or the breathing tube placed in your throat during surgery. If this causes discomfort, gargle with warm salt water. The discomfort should disappear within 24 hours.  If you had a scopolamine patch placed behind your ear for the management of post- operative nausea and/or vomiting:  1. The medication in the patch is effective for 72 hours, after which it should be removed.  Wrap patch in a tissue and discard in the trash. Wash hands thoroughly with soap and water. 2. You may remove the patch earlier than 72 hours if you experience unpleasant side effects which may include dry mouth, dizziness or visual disturbances. 3. Avoid touching the patch. Wash your  hands with soap and water after contact with the patch.

## 2015-05-14 NOTE — Transfer of Care (Signed)
Immediate Anesthesia Transfer of Care Note  Patient: [name]  Procedure(s) Performed: Procedure(s): CLOSED REDUCTION NASAL FRACTURE (N/A)  Patient Location: PACU  Anesthesia Type:General  Level of Consciousness: awake and alert   Airway & Oxygen Therapy: Patient Spontanous Breathing and Patient connected to face mask oxygen  Post-op Assessment: Report given to RN and Post -op Vital signs reviewed and stable  Post vital signs: Reviewed and stable  Last Vitals:  Filed Vitals:   05/14/15 1053  BP:   Pulse: 87  Temp:   Resp:     Complications: No apparent anesthesia complications

## 2015-05-14 NOTE — H&P (Signed)
Cc: Nasal fracture  HPI: The patient is a 49 y/o female who presents today for evaluation of facial trauma. The patient is seen in consultation requested by [facility]. According to the patient, she fell down her front step on 05/05/2015. She hit her face on the brick steps. The patient was seen at the ER where CT showed a mildly depressed right nasal bone fracture. The patient feels like her nose is slightly deformed to the left. She is having a lot of difficulty breathing through her nose. The patient is also complaining today recurrent headaches and neck pain from her fall. The patient denies any previous facial trauma.   The patient's review of systems (constitutional, eyes, ENT, cardiovascular, respiratory, GI, musculoskeletal, skin, neurologic, psychiatric, endocrine, hematologic, allergic) is noted in the ROS questionnaire.  It is reviewed with the patient.   Family health history: Diabetes, hearing loss, heart disease.   Major events: Partial hysterectomy.   Ongoing medical problems: Headache, anxiety disorder, wearing hearing aid in left ear.   Social history: The patient is single. She denies the use of tobacco or illegal drugs. She drinks few beers a a week.   Exam: General: Communicates without difficulty, well nourished, no acute distress. Head: Normocephalic, evidence injury, tenderness, facial buttresses intact without stepoff. Eyes: PERRL, EOMI. No scleral icterus, conjunctivae clear. Bilateral periorbital ecchymosis is noted. Neuro: CN II exam reveals vision grossly intact.  No nystagmus at any point of gaze. Ears: Auricles well formed without lesions.  Ear canals are intact without mass or lesion.  No erythema or edema is appreciated.  The TMs are intact without fluid. Nose: External evaluation reveals normal support and skin without lesions. Dorsum is slightly deviated to the left with edema. Anterior rhinoscopy reveals congested mucosa over anterior aspect of inferior  turbinates and intact septum.  No purulence noted. Oral:  Oral cavity and oropharynx are intact, symmetric, without erythema or edema.  Mucosa is moist without lesions. Neck: Limited range of motion with pain.  There is no significant lymphadenopathy.  No masses palpable.  Thyroid bed within normal limits to palpation.  Parotid glands and submandibular glands equal bilaterally without mass.  Trachea is midline. Neuro:  CN 2-12 grossly intact. Gait normal. Vestibular: No nystagmus at any point of gaze. The cerebellar examination is unremarkable.   Assessment Slightly depressed right nasal bone fracture resulting in mild nasal deformity s/p fall.  Plan  1.  The physical exam and CT findings are reviewed with the patient. 2.  Treatment options include conservative observation versus closed reduction. The risks, benefits, alternatives, and details of the procedure are reviewed with the patient. Questions are invited and answered. 3.  The patient would like to proceed with closed reduction of her nasal fracture. The procedure will be scheduled as soon as possible.

## 2015-05-15 ENCOUNTER — Encounter (HOSPITAL_BASED_OUTPATIENT_CLINIC_OR_DEPARTMENT_OTHER): Payer: Self-pay | Admitting: Otolaryngology

## 2015-07-16 ENCOUNTER — Ambulatory Visit (HOSPITAL_COMMUNITY): Payer: BLUE CROSS/BLUE SHIELD

## 2015-07-16 ENCOUNTER — Other Ambulatory Visit: Payer: Self-pay | Admitting: Orthopedic Surgery

## 2015-07-16 ENCOUNTER — Other Ambulatory Visit (HOSPITAL_COMMUNITY): Payer: Self-pay | Admitting: Orthopedic Surgery

## 2015-07-16 ENCOUNTER — Other Ambulatory Visit: Payer: Self-pay

## 2015-07-16 DIAGNOSIS — R079 Chest pain, unspecified: Secondary | ICD-10-CM

## 2015-07-17 ENCOUNTER — Ambulatory Visit (HOSPITAL_COMMUNITY)
Admission: RE | Admit: 2015-07-17 | Discharge: 2015-07-17 | Disposition: A | Discharge status: Home / Self Care | Payer: BLUE CROSS/BLUE SHIELD | Source: Ambulatory Visit | Attending: Orthopedic Surgery | Admitting: Orthopedic Surgery

## 2015-07-17 DIAGNOSIS — I77819 Aortic ectasia, unspecified site: Secondary | ICD-10-CM | POA: Diagnosis not present

## 2015-07-17 DIAGNOSIS — R079 Chest pain, unspecified: Secondary | ICD-10-CM

## 2015-07-19 ENCOUNTER — Other Ambulatory Visit: Payer: BLUE CROSS/BLUE SHIELD

## 2015-09-30 ENCOUNTER — Encounter (HOSPITAL_COMMUNITY): Payer: Self-pay | Admitting: *Deleted

## 2015-09-30 ENCOUNTER — Inpatient Hospital Stay (HOSPITAL_COMMUNITY)
Admission: AD | Admit: 2015-09-30 | Discharge: 2015-09-30 | Disposition: A | Discharge status: Home / Self Care | Payer: BLUE CROSS/BLUE SHIELD | Source: Ambulatory Visit | Attending: Obstetrics and Gynecology | Admitting: Obstetrics and Gynecology

## 2015-09-30 DIAGNOSIS — N83201 Unspecified ovarian cyst, right side: Secondary | ICD-10-CM | POA: Diagnosis not present

## 2015-09-30 DIAGNOSIS — R1031 Right lower quadrant pain: Secondary | ICD-10-CM | POA: Diagnosis present

## 2015-09-30 DIAGNOSIS — G8929 Other chronic pain: Secondary | ICD-10-CM | POA: Diagnosis not present

## 2015-09-30 DIAGNOSIS — Z87891 Personal history of nicotine dependence: Secondary | ICD-10-CM | POA: Diagnosis not present

## 2015-09-30 LAB — URINALYSIS, ROUTINE W REFLEX MICROSCOPIC
Bilirubin Urine: NEGATIVE
GLUCOSE, UA: NEGATIVE mg/dL
Ketones, ur: NEGATIVE mg/dL
LEUKOCYTES UA: NEGATIVE
NITRITE: NEGATIVE
Protein, ur: NEGATIVE mg/dL
SPECIFIC GRAVITY, URINE: 1.01 (ref 1.005–1.030)
pH: 7 (ref 5.0–8.0)

## 2015-09-30 LAB — URINE MICROSCOPIC-ADD ON: WBC, UA: NONE SEEN WBC/hpf (ref 0–5)

## 2015-09-30 MED ORDER — OXYCODONE-ACETAMINOPHEN 5-325 MG PO TABS: 2.0000 | ORAL_TABLET | Freq: Four times a day (QID) | ORAL | Status: DC | PRN

## 2015-09-30 MED ORDER — HYDROMORPHONE HCL 1 MG/ML IJ SOLN
0.5000 mg | Freq: Once | INTRAMUSCULAR | Status: AC
  Administered 2015-09-30: 0.5 mg via INTRAVENOUS
  Filled 2015-09-30: qty 1

## 2015-09-30 MED ORDER — LACTATED RINGERS IV SOLN
Freq: Once | INTRAVENOUS | Status: AC
  Administered 2015-09-30: 17:00:00 via INTRAVENOUS

## 2015-09-30 MED ORDER — OXYCODONE-ACETAMINOPHEN 5-325 MG PO TABS: 2.0000 | ORAL_TABLET | ORAL | Status: DC | PRN

## 2015-09-30 NOTE — MAU Note (Signed)
Pt. Has been seen at her obgyn for a cyst in August and then pt. Went back for a follow up in November, pt was told that it was a possible tumor.  Pt. Is now scheduled for surgery on March 22nd.  Pt. Is having cramping in her lower abdomen and lower back, pt. tried to get into the office on Friday and wasn't able to get an appt. The provider prescribed her Percocet for pain, but it isn't helping.  She is also having frequency with urination. Pt. Denies any bleeding.

## 2015-09-30 NOTE — MAU Provider Note (Signed)
History     CSN: [id number]  Arrival date and time: 09/30/15 1448   First Provider Initiated Contact with Patient 09/30/15 1526      Chief Complaint  Patient presents with  . Abdominal Pain   HPI [name] 50 y.o. Comes to MAU with severe pain in RLQ which is not responding to the pain medication she was prescribed on Friday - Percocet.  She is taking one percocet every 6 hours and is not getting relief.  She has been rocking with the pain and using a heating pad.  She only slept about 2 hours last night.  Tramadol has not helped and last night she took a Flexeril and there was no relief with adding that medication.   She is worried about the surgery and whether she has cancer.  She has had a partial hysterectomy and does not have a uterus.  States the pain feels like burning inside constantly all across the lower abdomen but especially on the right side and periodically she feels crampy all across the lower abdomen as well, almost like menstrual cramping but times 10.  Additionally she has a history of Crohn's disesase and does not take ibuprofen.  Past Medical History  Diagnosis Date  . DDD (degenerative disc disease), lumbar   . Hearing loss     left  . Tension headache   . Bulging lumbar disc   . Crohn's disease ([place])   . Chronic lower back pain   . Wears hearing aid     left ear  . TMJ syndrome   . Dental crowns present   . Seasonal allergies   . Nasal bone fracture 04/2015    Past Surgical History  Procedure Laterality Date  . Tonsillectomy      age 30  . Bunionectomy    . Appendectomy    . Stapedectomy Left   . Knee arthroscopy    . Breast enhancement surgery  1998  . Orif finger fracture Right     middle finger  . Finger surgery Right     middle finger - x 2 others besides ORIF  . Abdominal hysterectomy  10/23/2005    partial  . Closed reduction nasal fracture N/A 05/14/2015    Procedure: CLOSED REDUCTION NASAL FRACTURE;  Surgeon: Leta Baptist, MD;  Location:  [facility];  Service: ENT;  Laterality: N/A;    Family History  Problem Relation Age of Onset  . Alzheimer's disease Mother     Social History  Substance Use Topics  . Smoking status: Former Smoker -- 2.00 packs/day    Types: Cigarettes    Quit date: 08/01/2008  . Smokeless tobacco: Never Used     Comment: 1 cig./day  . Alcohol Use: Yes     Comment: occasionally    Allergies:  Allergies  Allergen Reactions  . Sulfa Antibiotics Itching    Prescriptions prior to admission  Medication Sig Dispense Refill Last Dose  . ALPRAZolam (XANAX) 0.25 MG tablet Take 0.25 mg by mouth daily as needed for anxiety.   09/30/2015 at Unknown time  . Ascorbic Acid (VITAMIN C PO) Take 1 tablet by mouth daily.   09/30/2015 at Unknown time  . CALCIUM PO Take 2 tablets by mouth daily.   09/30/2015 at Unknown time  . Cholecalciferol (VITAMIN D3) 5000 units TABS Take 2 tablets by mouth daily.   09/30/2015 at Unknown time  . CHOLINE PO Take 2 tablets by mouth daily.   09/30/2015 at Unknown time  .  cyclobenzaprine (FLEXERIL) 10 MG tablet Take 10 mg by mouth 3 (three) times daily as needed for muscle spasms.   09/30/2015 at Unknown time  . gabapentin (NEURONTIN) 300 MG capsule Take 300 mg by mouth 2 (two) times daily.  2 09/30/2015 at Unknown time  . glucosamine-chondroitin 500-400 MG tablet Take 2 tablets by mouth at bedtime. With Tumeric and magnesium   09/30/2015 at Unknown time  . Omega-3 Fatty Acids (FISH OIL) 1000 MG CAPS Take 2 capsules by mouth daily.   09/30/2015 at Unknown time  . oxyCODONE (ROXICODONE) 15 MG immediate release tablet TAKE 1 TABLET TWICE DAILY AS NEEDED FOR PAIN  0 09/30/2015 at Unknown time  . Thiamine HCl (THIAMINE PO) Take 2 tablets by mouth daily.   09/30/2015 at Unknown time  . traMADol (ULTRAM) 50 MG tablet TAKE 1 TABLET EVERY 6 HOURS AS NEEDED FOR PAIN  0 09/30/2015 at Unknown time  . TURMERIC PO Take 2 tablets by mouth daily.    09/30/2015 at Unknown time  . zolpidem (AMBIEN CR) 12.5  MG CR tablet Take 12.5 mg by mouth every 3 (three) days as needed for sleep.   0 09/29/2015 at Unknown time    Review of Systems  Constitutional: Negative for fever.  Gastrointestinal: Positive for abdominal pain. Negative for nausea, vomiting, diarrhea and constipation.  Genitourinary: Positive for frequency. Negative for dysuria, hematuria and flank pain.   Physical Exam   Blood pressure 136/86, pulse 69, temperature 98 F (36.7 C), temperature source Oral, height 5' 7"  (1.702 m), weight 159 lb 6 oz (72.292 kg).  Physical Exam  Nursing note and vitals reviewed. Constitutional: She is oriented to person, place, and time. She appears well-developed and well-nourished.  HENT:  Head: Normocephalic.  Eyes: EOM are normal.  Neck: Neck supple.  GI: Soft. There is tenderness. There is no rebound and no guarding.  Most tender in RLQ, no masses.  Tender all across lower abdomen as well.  Musculoskeletal: Normal range of motion.  Neurological: She is alert and oriented to person, place, and time.  Skin: Skin is warm and dry.  Psychiatric: She has a normal mood and affect.    MAU Course  Procedures Results for orders placed or performed during the hospital encounter of 09/30/15 (from the past 24 hour(s))  Urinalysis, Routine w reflex microscopic (not at [facility])     Status: Abnormal   Collection Time: 09/30/15  3:00 PM  Result Value Ref Range   Color, Urine YELLOW YELLOW   APPearance CLEAR CLEAR   Specific Gravity, Urine 1.010 1.005 - 1.030   pH 7.0 5.0 - 8.0   Glucose, UA NEGATIVE NEGATIVE mg/dL   Hgb urine dipstick TRACE (A) NEGATIVE   Bilirubin Urine NEGATIVE NEGATIVE   Ketones, ur NEGATIVE NEGATIVE mg/dL   Protein, ur NEGATIVE NEGATIVE mg/dL   Nitrite NEGATIVE NEGATIVE   Leukocytes, UA NEGATIVE NEGATIVE  Urine microscopic-add on     Status: Abnormal   Collection Time: 09/30/15  3:00 PM  Result Value Ref Range   Squamous Epithelial / LPF 0-5 (A) NONE SEEN   WBC, UA NONE SEEN 0 -  5 WBC/hpf   RBC / HPF 0-5 0 - 5 RBC/hpf   Bacteria, UA RARE (A) NONE SEEN   Urine culture ordered as client is having urinary frequency.  MDM Consult with Dr. Carlis Abbott by phone.  Will give IV of LR and 0.5 mg of Dilaudid - to repeat this dose if no relief.  Also will increase dose  of Percocet until she is seen in the office on Tuesday.  2 percocet tablets every 6 hours.  Client had 2 doses of dilaudid 0.5 mg IV and then her pain was down from 8/10 to 3/10.  Discussed that this is a dramatic improvement and that the goal is not 100% pain relief but manageable pain.  Client looks much better and is agreeable for discharge.  Sitting up in bed and rocking has stopped.  Still holds right lower abdomen but is now able to smile and is able to move to sitting on the side of the bed.  Assessment and Plan  Abdominal pain  - questionable of the source of the pain.  The ovarian cyst on the right side (ultrasound on chart in the office) is relatively small 1cm per Dr. Carlis Abbott.    Plan Increase percocet to 2 pills every 6 hours to manage your pain. With increased pain medication, you may have some constipation. Keep your appointment on Tuesday for the preop with Dr. Philis Pique. Call the office if you are worse tomorrow.   Season Astacio 09/30/2015, 3:49 PM

## 2015-09-30 NOTE — Discharge Instructions (Signed)
Get your pain medication filled on the way home.  You can take 2 percocet tablets every 6 hours if needed for pain. Keep your appointment with Dr. Philis Pique tomorrow. Call the office tomorrow if you are having problems.

## 2015-10-02 ENCOUNTER — Other Ambulatory Visit: Payer: Self-pay | Admitting: Obstetrics and Gynecology

## 2015-10-02 LAB — URINE CULTURE

## 2015-10-04 ENCOUNTER — Other Ambulatory Visit: Payer: Self-pay | Admitting: Obstetrics and Gynecology

## 2015-10-09 ENCOUNTER — Encounter (HOSPITAL_COMMUNITY)
Admission: RE | Admit: 2015-10-09 | Discharge: 2015-10-09 | Disposition: A | Discharge status: Home / Self Care | Payer: BLUE CROSS/BLUE SHIELD | Source: Ambulatory Visit | Attending: Obstetrics and Gynecology | Admitting: Obstetrics and Gynecology

## 2015-10-09 ENCOUNTER — Encounter (HOSPITAL_COMMUNITY): Payer: Self-pay

## 2015-10-09 DIAGNOSIS — Z01812 Encounter for preprocedural laboratory examination: Secondary | ICD-10-CM | POA: Diagnosis not present

## 2015-10-09 DIAGNOSIS — N83201 Unspecified ovarian cyst, right side: Secondary | ICD-10-CM | POA: Insufficient documentation

## 2015-10-09 LAB — CBC
HEMATOCRIT: 42.7 % (ref 36.0–46.0)
Hemoglobin: 14.6 g/dL (ref 12.0–15.0)
MCH: 30.7 pg (ref 26.0–34.0)
MCHC: 34.2 g/dL (ref 30.0–36.0)
MCV: 89.7 fL (ref 78.0–100.0)
PLATELETS: 212 10*3/uL (ref 150–400)
RBC: 4.76 MIL/uL (ref 3.87–5.11)
RDW: 13.3 % (ref 11.5–15.5)
WBC: 9 10*3/uL (ref 4.0–10.5)

## 2015-10-09 LAB — TYPE AND SCREEN
ABO/RH(D): O NEG
ANTIBODY SCREEN: NEGATIVE

## 2015-10-09 NOTE — Patient Instructions (Addendum)
Your procedure is scheduled on:  Wednesday, October 17, 2015  Enter through the Main Entrance of [facility] at:  9:00 AM (8:30 per Dr. Malachi Carl request to patient)  Pick up the phone at the desk and dial [phone].  Call this number if you have problems the morning of surgery: [phone].  Remember:  Do NOT eat food or drink after:  Midnight Tuesday, October 16, 2015  Take these medicines the morning of surgery with a SIP OF WATER:  Gabapentin, Xanax if needed  Stop taking fish oil and turmeric at this time  Do NOT wear jewelry (body piercing), metal hair clips/bobby pins, make-up, or nail polish. Do NOT wear lotions, powders, or perfumes.  You may wear deoderant. Do NOT shave for 48 hours prior to surgery. Do NOT bring valuables to the hospital. Contacts, dentures, or bridgework may not be worn into surgery.  Have a responsible adult drive you home and stay with you for 24 hours after your procedure

## 2015-10-16 NOTE — H&P (Addendum)
50 y.o. yo complains of severe RLQ pain with ovarian cyst of questionable malignant potential.  Pt had RLQ pain back in September and underwent US.  US showed RO with complex with mixed echogenicity. One area was 1 cm solid appearing and 1.5 cm thick walled cyst off inferior border.  There may have been some increase blood flow. This was Snyder repeat of previous and the cyst had remained stable but small amount of ff was seen.  I d/w Gyn Onc and they agreed it was low potential for malignancy since CA 125 was 13.  Since that time in last few months her pain has become worse on R, now requiring narcotics for help.  She does have Snyder history of colitis but is unsure if this is Snyder flare.  Pt on one anaprox Snyder day until surgery and narcotics were refilled.  .  Past Medical History  Diagnosis Date  . DDD (degenerative disc disease), lumbar   . Hearing loss     left  . Tension headache   . Bulging lumbar disc   . Crohn's disease ([place])   . Chronic lower back pain   . Wears hearing aid     left ear  . TMJ syndrome   . Dental crowns present   . Seasonal allergies   . Nasal bone fracture 04/2015   Past Surgical History  Procedure Laterality Date  . Tonsillectomy      age 83  . Bunionectomy    . Appendectomy    . Stapedectomy Left   . Knee arthroscopy    . Breast enhancement surgery  1998  . Orif finger fracture Right     middle finger  . Finger surgery Right     middle finger - x 2 others besides ORIF  . Abdominal hysterectomy  10/23/2005    partial  . Closed reduction nasal fracture N/Snyder 05/14/2015    Procedure: CLOSED REDUCTION NASAL FRACTURE;  Surgeon: Leta Baptist, MD;  Location: [facility];  Service: ENT;  Laterality: N/Snyder;    Social History   Social History  . Marital Status: Divorced    Spouse Name: N/Snyder  . Number of Children: N/Snyder  . Years of Education: N/Snyder   Occupational History  . Not on file.   Social History Main Topics  . Smoking status: Former Smoker -- 2.00  packs/day    Types: Cigarettes    Quit date: 08/01/2008  . Smokeless tobacco: Never Used     Comment: 1 cig./day  . Alcohol Use: Yes     Comment: occasionally  . Drug Use: No  . Sexual Activity: Yes    Birth Control/ Protection: Surgical   Other Topics Concern  . Not on file   Social History Narrative    No current facility-administered medications on file prior to encounter.   Current Outpatient Prescriptions on File Prior to Encounter  Medication Sig Dispense Refill  . ALPRAZolam (XANAX) 0.25 MG tablet Take 0.25 mg by mouth daily as needed for anxiety.    . Ascorbic Acid (VITAMIN C PO) Take 1 tablet by mouth daily.    . cyclobenzaprine (FLEXERIL) 10 MG tablet Take 10 mg by mouth 3 (three) times daily as needed for muscle spasms.    Marland Kitchen glucosamine-chondroitin 500-400 MG tablet Take 2 tablets by mouth at bedtime. With Tumeric and magnesium    . Omega-3 Fatty Acids (FISH OIL) 1000 MG CAPS Take 2 capsules by mouth daily.    . TURMERIC PO Take 2  tablets by mouth daily.       Allergies  Allergen Reactions  . Sulfa Antibiotics Itching    @VITALS2 @  Lungs: clear to ascultation Cor:  RRR Abdomen:  soft, nontender, nondistended. Ex:  no cords, erythema Pelvic:  Vulva: no masses, no atrophy, no lesions Vagina: no erythema, no abnormal vaginal discharge, no vesicle(s) or ulcers, no cystocele, no rectocele, tenderness Cervix: sample taken for Snyder Pap smear Uterus: absent Bladder/Urethra: normal meatus, no urethral discharge, no urethral mass, bladder non distended, Urethra well supported Adnexa/Parametria: now tenderness, no parametrial mass, now with adnexal tenderness, no ovarian mass    Snyder:  RLQ pain and RO with complex cystic mass.  Pt is for Robotic BSO(s/p hyst in past).  I had Snyder long discussion with pt about expectations for pain control post op if endometriosis present, or if pain is really from colitis or back.  Pt understands more may need to have additional treatment with pt  for pain.  Also d/w pt possibility of severe adhesive disease and external bowel disease that may not allow me to continue with BSO.  She also understands that in the event that it appears that she does have ovarian cancer that I will abort procedure and get her the appropriate referal post op.     P: All risks, benefits and alternatives d/w patient and she desires to proceed.  Patient has undergone Snyder modified bowel prep and will receive preop antibiotics and SCDs during the operation.     [name]

## 2015-10-16 NOTE — Anesthesia Preprocedure Evaluation (Addendum)
Anesthesia Evaluation  Patient identified by MRN, date of birth, ID band Patient awake    Reviewed: Allergy & Precautions, NPO status , Patient's Chart, lab work & pertinent test results  Airway Mallampati: II  TM Distance: >3 FB Neck ROM: Full    Dental   Pulmonary Current Smoker, former smoker,    breath sounds clear to auscultation       Cardiovascular negative cardio ROS   Rhythm:Regular Rate:Normal     Neuro/Psych  Headaches,    GI/Hepatic negative GI ROS, Neg liver ROS,   Endo/Other  negative endocrine ROS  Renal/GU negative Renal ROS     Musculoskeletal  (+) Arthritis ,   Abdominal   Peds  Hematology negative hematology ROS (+)   Anesthesia Other Findings   Reproductive/Obstetrics                           Anesthesia Physical  Anesthesia Plan  ASA: II  Anesthesia Plan: General   Post-op Pain Management:    Induction: Intravenous  Airway Management Planned: Oral ETT  Additional Equipment:   Intra-op Plan:   Post-operative Plan:   Informed Consent: I have reviewed the patients History and Physical, chart, labs and discussed the procedure including the risks, benefits and alternatives for the proposed anesthesia with the patient or authorized representative who has indicated his/her understanding and acceptance.     Plan Discussed with: CRNA  Anesthesia Plan Comments: (Patient with chronic pain, will need to continue oral medications throughout hospital stay Recommend ketamine prior to incision as part of multimodal pain therapy in patient with history of chronic pain, consider acetaminophen and toradol also)      Anesthesia Quick Evaluation

## 2015-10-17 ENCOUNTER — Ambulatory Visit (HOSPITAL_COMMUNITY): Payer: BLUE CROSS/BLUE SHIELD | Admitting: Anesthesiology

## 2015-10-17 ENCOUNTER — Encounter (HOSPITAL_COMMUNITY)
Admission: RE | Disposition: A | Discharge status: Home / Self Care | Payer: Self-pay | Source: Ambulatory Visit | Attending: Obstetrics and Gynecology

## 2015-10-17 ENCOUNTER — Ambulatory Visit (HOSPITAL_COMMUNITY)
Admission: RE | Admit: 2015-10-17 | Discharge: 2015-10-17 | Disposition: A | Discharge status: Home / Self Care | Payer: BLUE CROSS/BLUE SHIELD | Source: Ambulatory Visit | Attending: Obstetrics and Gynecology | Admitting: Obstetrics and Gynecology

## 2015-10-17 DIAGNOSIS — Z882 Allergy status to sulfonamides status: Secondary | ICD-10-CM | POA: Insufficient documentation

## 2015-10-17 DIAGNOSIS — M1990 Unspecified osteoarthritis, unspecified site: Secondary | ICD-10-CM | POA: Diagnosis not present

## 2015-10-17 DIAGNOSIS — N803 Endometriosis of pelvic peritoneum: Secondary | ICD-10-CM | POA: Diagnosis not present

## 2015-10-17 DIAGNOSIS — Z87891 Personal history of nicotine dependence: Secondary | ICD-10-CM | POA: Diagnosis not present

## 2015-10-17 DIAGNOSIS — G8929 Other chronic pain: Secondary | ICD-10-CM | POA: Insufficient documentation

## 2015-10-17 DIAGNOSIS — N736 Female pelvic peritoneal adhesions (postinfective): Secondary | ICD-10-CM | POA: Insufficient documentation

## 2015-10-17 DIAGNOSIS — M545 Low back pain: Secondary | ICD-10-CM | POA: Diagnosis not present

## 2015-10-17 DIAGNOSIS — N83202 Unspecified ovarian cyst, left side: Secondary | ICD-10-CM | POA: Diagnosis not present

## 2015-10-17 DIAGNOSIS — R1031 Right lower quadrant pain: Secondary | ICD-10-CM | POA: Diagnosis present

## 2015-10-17 DIAGNOSIS — N83201 Unspecified ovarian cyst, right side: Secondary | ICD-10-CM | POA: Diagnosis not present

## 2015-10-17 DIAGNOSIS — K5090 Crohn's disease, unspecified, without complications: Secondary | ICD-10-CM | POA: Diagnosis not present

## 2015-10-17 DIAGNOSIS — M5136 Other intervertebral disc degeneration, lumbar region: Secondary | ICD-10-CM | POA: Diagnosis not present

## 2015-10-17 HISTORY — PX: ROBOTIC ASSISTED BILATERAL SALPINGO OOPHERECTOMY: SHX6078

## 2015-10-17 HISTORY — PX: ROBOTIC ASSISTED LAPAROSCOPIC LYSIS OF ADHESION: SHX6080

## 2015-10-17 SURGERY — ROBOTIC ASSISTED BILATERAL SALPINGO OOPHORECTOMY
Anesthesia: General | Site: Abdomen

## 2015-10-17 MED ORDER — LACTATED RINGERS IR SOLN
Status: DC | PRN
  Administered 2015-10-17: 3000 mL

## 2015-10-17 MED ORDER — HYDROMORPHONE HCL 1 MG/ML IJ SOLN: 1.0000 mg | INTRAMUSCULAR | Status: DC | PRN

## 2015-10-17 MED ORDER — OXYCODONE-ACETAMINOPHEN 5-325 MG PO TABS: 2.0000 | ORAL_TABLET | ORAL | Status: DC | PRN

## 2015-10-17 MED ORDER — GLYCOPYRROLATE 0.2 MG/ML IJ SOLN
INTRAMUSCULAR | Status: AC
  Filled 2015-10-17: qty 3

## 2015-10-17 MED ORDER — KETAMINE HCL 10 MG/ML IJ SOLN
INTRAMUSCULAR | Status: AC
  Filled 2015-10-17: qty 1

## 2015-10-17 MED ORDER — HYDROMORPHONE HCL 1 MG/ML IJ SOLN
INTRAMUSCULAR | Status: AC
  Administered 2015-10-17: 0.5 mg via INTRAVENOUS
  Filled 2015-10-17: qty 1

## 2015-10-17 MED ORDER — FENTANYL CITRATE (PF) 100 MCG/2ML IJ SOLN
100.0000 ug | Freq: Once | INTRAMUSCULAR | Status: AC
  Administered 2015-10-17: 100 ug via INTRAVENOUS

## 2015-10-17 MED ORDER — SUGAMMADEX SODIUM 200 MG/2ML IV SOLN
INTRAVENOUS | Status: DC | PRN
  Administered 2015-10-17: 148.8 mg via INTRAVENOUS

## 2015-10-17 MED ORDER — ROPIVACAINE HCL 5 MG/ML IJ SOLN
INTRAMUSCULAR | Status: AC
  Filled 2015-10-17: qty 30

## 2015-10-17 MED ORDER — HYDROMORPHONE HCL 1 MG/ML IJ SOLN
INTRAMUSCULAR | Status: DC | PRN
  Administered 2015-10-17: 1 mg via INTRAVENOUS

## 2015-10-17 MED ORDER — ROCURONIUM BROMIDE 100 MG/10ML IV SOLN
INTRAVENOUS | Status: DC | PRN
  Administered 2015-10-17: 40 mg via INTRAVENOUS
  Administered 2015-10-17: 10 mg via INTRAVENOUS
  Administered 2015-10-17: 20 mg via INTRAVENOUS

## 2015-10-17 MED ORDER — HYDROMORPHONE HCL 1 MG/ML IJ SOLN
0.2500 mg | INTRAMUSCULAR | Status: DC | PRN
  Administered 2015-10-17 (×4): 0.5 mg via INTRAVENOUS

## 2015-10-17 MED ORDER — MIDAZOLAM HCL 2 MG/2ML IJ SOLN
1.0000 mg | Freq: Once | INTRAMUSCULAR | Status: AC
  Administered 2015-10-17: 1 mg via INTRAVENOUS

## 2015-10-17 MED ORDER — LIDOCAINE HCL (CARDIAC) 20 MG/ML IV SOLN
INTRAVENOUS | Status: AC
  Filled 2015-10-17: qty 5

## 2015-10-17 MED ORDER — MIDAZOLAM HCL 2 MG/2ML IJ SOLN
INTRAMUSCULAR | Status: DC | PRN
  Administered 2015-10-17 (×2): 2 mg via INTRAVENOUS

## 2015-10-17 MED ORDER — GABAPENTIN 400 MG PO CAPS
400.0000 mg | ORAL_CAPSULE | Freq: Once | ORAL | Status: DC
  Filled 2015-10-17: qty 1

## 2015-10-17 MED ORDER — KETOROLAC TROMETHAMINE 30 MG/ML IJ SOLN
INTRAMUSCULAR | Status: AC
  Filled 2015-10-17: qty 1

## 2015-10-17 MED ORDER — PROPOFOL 10 MG/ML IV BOLUS
INTRAVENOUS | Status: DC | PRN
  Administered 2015-10-17: 200 mg via INTRAVENOUS

## 2015-10-17 MED ORDER — ESMOLOL HCL 100 MG/10ML IV SOLN
INTRAVENOUS | Status: DC | PRN
  Administered 2015-10-17 (×2): 10 mg via INTRAVENOUS

## 2015-10-17 MED ORDER — KETAMINE HCL 10 MG/ML IJ SOLN
INTRAMUSCULAR | Status: DC | PRN
  Administered 2015-10-17: 50 mg via INTRAVENOUS

## 2015-10-17 MED ORDER — GABAPENTIN 400 MG PO CAPS
800.0000 mg | ORAL_CAPSULE | Freq: Once | ORAL | Status: AC
  Administered 2015-10-17: 800 mg via ORAL
  Filled 2015-10-17: qty 2

## 2015-10-17 MED ORDER — NEOSTIGMINE METHYLSULFATE 10 MG/10ML IV SOLN
INTRAVENOUS | Status: AC
  Filled 2015-10-17: qty 1

## 2015-10-17 MED ORDER — SCOPOLAMINE 1 MG/3DAYS TD PT72
1.0000 | MEDICATED_PATCH | Freq: Once | TRANSDERMAL | Status: DC
  Administered 2015-10-17: 1.5 mg via TRANSDERMAL

## 2015-10-17 MED ORDER — ONDANSETRON HCL 4 MG/2ML IJ SOLN
INTRAMUSCULAR | Status: AC
  Filled 2015-10-17: qty 2

## 2015-10-17 MED ORDER — LACTATED RINGERS IV SOLN
INTRAVENOUS | Status: DC
  Administered 2015-10-17 (×2): via INTRAVENOUS

## 2015-10-17 MED ORDER — DEXAMETHASONE SODIUM PHOSPHATE 10 MG/ML IJ SOLN
INTRAMUSCULAR | Status: DC | PRN
  Administered 2015-10-17: 4 mg via INTRAVENOUS

## 2015-10-17 MED ORDER — OXYCODONE-ACETAMINOPHEN 5-325 MG PO TABS: 2.0000 | ORAL_TABLET | Freq: Four times a day (QID) | ORAL | Status: DC | PRN

## 2015-10-17 MED ORDER — OXYCODONE HCL 5 MG PO TABS: 15.0000 mg | ORAL_TABLET | Freq: Once | ORAL | Status: DC | PRN

## 2015-10-17 MED ORDER — FENTANYL CITRATE (PF) 250 MCG/5ML IJ SOLN
INTRAMUSCULAR | Status: AC
  Filled 2015-10-17: qty 5

## 2015-10-17 MED ORDER — OXYCODONE HCL 5 MG/5ML PO SOLN: 5.0000 mg | Freq: Once | ORAL | Status: DC | PRN

## 2015-10-17 MED ORDER — HYDROMORPHONE HCL 1 MG/ML IJ SOLN
0.2500 mg | INTRAMUSCULAR | Status: DC | PRN
  Administered 2015-10-17 (×2): 0.5 mg via INTRAVENOUS

## 2015-10-17 MED ORDER — OXYCODONE-ACETAMINOPHEN 5-325 MG PO TABS
ORAL_TABLET | ORAL | Status: AC
  Filled 2015-10-17: qty 2

## 2015-10-17 MED ORDER — PROPOFOL 10 MG/ML IV BOLUS
INTRAVENOUS | Status: AC
  Filled 2015-10-17: qty 20

## 2015-10-17 MED ORDER — FENTANYL CITRATE (PF) 100 MCG/2ML IJ SOLN
INTRAMUSCULAR | Status: DC | PRN
  Administered 2015-10-17: 50 ug via INTRAVENOUS
  Administered 2015-10-17 (×2): 100 ug via INTRAVENOUS

## 2015-10-17 MED ORDER — HYDROMORPHONE HCL 1 MG/ML IJ SOLN
INTRAMUSCULAR | Status: AC
  Filled 2015-10-17: qty 1

## 2015-10-17 MED ORDER — LIDOCAINE HCL (CARDIAC) 20 MG/ML IV SOLN
INTRAVENOUS | Status: DC | PRN
  Administered 2015-10-17: 100 mg via INTRAVENOUS

## 2015-10-17 MED ORDER — SODIUM CHLORIDE 0.9 % IJ SOLN
INTRAMUSCULAR | Status: AC
  Filled 2015-10-17: qty 50

## 2015-10-17 MED ORDER — MIDAZOLAM HCL 2 MG/2ML IJ SOLN
INTRAMUSCULAR | Status: AC
  Administered 2015-10-17: 1 mg via INTRAVENOUS
  Filled 2015-10-17: qty 2

## 2015-10-17 MED ORDER — MIDAZOLAM HCL 2 MG/2ML IJ SOLN
INTRAMUSCULAR | Status: AC
  Filled 2015-10-17: qty 2

## 2015-10-17 MED ORDER — ESMOLOL HCL 100 MG/10ML IV SOLN
INTRAVENOUS | Status: AC
  Filled 2015-10-17: qty 10

## 2015-10-17 MED ORDER — KETOROLAC TROMETHAMINE 30 MG/ML IJ SOLN
INTRAMUSCULAR | Status: DC | PRN
  Administered 2015-10-17: 30 mg via INTRAVENOUS

## 2015-10-17 MED ORDER — SUGAMMADEX SODIUM 200 MG/2ML IV SOLN
INTRAVENOUS | Status: AC
  Filled 2015-10-17: qty 2

## 2015-10-17 MED ORDER — SCOPOLAMINE 1 MG/3DAYS TD PT72
MEDICATED_PATCH | TRANSDERMAL | Status: AC
  Administered 2015-10-17: 1.5 mg via TRANSDERMAL
  Filled 2015-10-17: qty 1

## 2015-10-17 MED ORDER — SODIUM CHLORIDE 0.9 % IJ SOLN: INTRAMUSCULAR | Status: DC | PRN

## 2015-10-17 MED ORDER — FENTANYL CITRATE (PF) 100 MCG/2ML IJ SOLN
INTRAMUSCULAR | Status: AC
  Administered 2015-10-17: 100 ug via INTRAVENOUS
  Filled 2015-10-17: qty 2

## 2015-10-17 MED ORDER — ROCURONIUM BROMIDE 100 MG/10ML IV SOLN
INTRAVENOUS | Status: AC
  Filled 2015-10-17: qty 1

## 2015-10-17 MED ORDER — SODIUM CHLORIDE 0.9 % IV SOLN
INTRAVENOUS | Status: DC | PRN
  Administered 2015-10-17: 60 mL

## 2015-10-17 MED ORDER — ACETAMINOPHEN 10 MG/ML IV SOLN
1000.0000 mg | Freq: Once | INTRAVENOUS | Status: AC
  Administered 2015-10-17: 1000 mg via INTRAVENOUS
  Filled 2015-10-17: qty 100

## 2015-10-17 MED ORDER — OXYCODONE-ACETAMINOPHEN 5-325 MG PO TABS: 1.0000 | ORAL_TABLET | ORAL | Status: DC | PRN

## 2015-10-17 MED ORDER — DEXAMETHASONE SODIUM PHOSPHATE 4 MG/ML IJ SOLN
INTRAMUSCULAR | Status: AC
  Filled 2015-10-17: qty 1

## 2015-10-17 MED ORDER — GABAPENTIN 800 MG PO TABS
800.0000 mg | ORAL_TABLET | Freq: Once | ORAL | Status: DC
  Filled 2015-10-17: qty 1

## 2015-10-17 MED ORDER — ARTIFICIAL TEARS OP OINT
TOPICAL_OINTMENT | OPHTHALMIC | Status: AC
  Filled 2015-10-17: qty 3.5

## 2015-10-17 MED ORDER — ONDANSETRON HCL 4 MG/2ML IJ SOLN
INTRAMUSCULAR | Status: DC | PRN
  Administered 2015-10-17: 4 mg via INTRAVENOUS

## 2015-10-17 MED ORDER — OXYCODONE-ACETAMINOPHEN 5-325 MG PO TABS
2.0000 | ORAL_TABLET | ORAL | Status: DC | PRN
  Administered 2015-10-17: 2 via ORAL

## 2015-10-17 MED ORDER — PROMETHAZINE HCL 25 MG/ML IJ SOLN: 6.2500 mg | INTRAMUSCULAR | Status: DC | PRN

## 2015-10-17 SURGICAL SUPPLY — 51 items
BARRIER ADHS 3X4 INTERCEED (GAUZE/BANDAGES/DRESSINGS) ×8 IMPLANT
BENZOIN TINCTURE PRP APPL 2/3 (GAUZE/BANDAGES/DRESSINGS) IMPLANT
CHLORAPREP W/TINT 26ML (MISCELLANEOUS) IMPLANT
CLOSURE WOUND 1/2 X4 (GAUZE/BANDAGES/DRESSINGS)
CLOTH BEACON ORANGE TIMEOUT ST (SAFETY) ×4 IMPLANT
CONT PATH 16OZ SNAP LID 3702 (MISCELLANEOUS) ×4 IMPLANT
COVER BACK TABLE 60X90IN (DRAPES) ×4 IMPLANT
COVER TIP SHEARS 8 DVNC (MISCELLANEOUS) ×2 IMPLANT
COVER TIP SHEARS 8MM DA VINCI (MISCELLANEOUS) ×2
DECANTER SPIKE VIAL GLASS SM (MISCELLANEOUS) ×8 IMPLANT
DEFOGGER SCOPE WARMER CLEARIFY (MISCELLANEOUS) ×4 IMPLANT
DRSG COVADERM PLUS 2X2 (GAUZE/BANDAGES/DRESSINGS) IMPLANT
DRSG OPSITE POSTOP 3X4 (GAUZE/BANDAGES/DRESSINGS) ×4 IMPLANT
ELECT REM PT RETURN 9FT ADLT (ELECTROSURGICAL) ×4
ELECTRODE REM PT RTRN 9FT ADLT (ELECTROSURGICAL) ×2 IMPLANT
GAUZE VASELINE 3X9 (GAUZE/BANDAGES/DRESSINGS) IMPLANT
GLOVE BIO SURGEON STRL SZ7 (GLOVE) ×8 IMPLANT
GLOVE BIOGEL PI IND STRL 7.0 (GLOVE) ×2 IMPLANT
GLOVE BIOGEL PI INDICATOR 7.0 (GLOVE) ×2
GLOVE ECLIPSE 6.5 STRL STRAW (GLOVE) ×12 IMPLANT
KIT ACCESSORY DA VINCI DISP (KITS) ×2
KIT ACCESSORY DVNC DISP (KITS) ×2 IMPLANT
LEGGING LITHOTOMY PAIR STRL (DRAPES) ×4 IMPLANT
LIQUID BAND (GAUZE/BANDAGES/DRESSINGS) ×4 IMPLANT
MANIPULATOR UTERINE 4.5 ZUMI (MISCELLANEOUS) IMPLANT
NS IRRIG 1000ML POUR BTL (IV SOLUTION) IMPLANT
PACK ROBOT WH (CUSTOM PROCEDURE TRAY) ×4 IMPLANT
PACK ROBOTIC GOWN (GOWN DISPOSABLE) ×4 IMPLANT
PAD PREP 24X48 CUFFED NSTRL (MISCELLANEOUS) ×8 IMPLANT
PAD TRENDELENBURG POSITION (MISCELLANEOUS) ×4 IMPLANT
POUCH SPECIMEN RETRIEVAL 10MM (ENDOMECHANICALS) ×4 IMPLANT
SET CYSTO W/LG BORE CLAMP LF (SET/KITS/TRAYS/PACK) IMPLANT
SET IRRIG TUBING LAPAROSCOPIC (IRRIGATION / IRRIGATOR) ×4 IMPLANT
SET TRI-LUMEN FLTR TB AIRSEAL (TUBING) ×4 IMPLANT
STRIP CLOSURE SKIN 1/2X4 (GAUZE/BANDAGES/DRESSINGS) IMPLANT
SUT VIC AB 0 CT1 27 (SUTURE)
SUT VIC AB 0 CT1 27XBRD ANBCTR (SUTURE) IMPLANT
SUT VIC AB 2-0 CT2 27 (SUTURE) IMPLANT
SUT VICRYL 0 UR6 27IN ABS (SUTURE) ×4 IMPLANT
SUT VICRYL RAPIDE 3 0 (SUTURE) ×8 IMPLANT
SYR 50ML LL SCALE MARK (SYRINGE) ×4 IMPLANT
TIP UTERINE 5.1X6CM LAV DISP (MISCELLANEOUS) IMPLANT
TIP UTERINE 6.7X10CM GRN DISP (MISCELLANEOUS) IMPLANT
TIP UTERINE 6.7X6CM WHT DISP (MISCELLANEOUS) IMPLANT
TIP UTERINE 6.7X8CM BLUE DISP (MISCELLANEOUS) IMPLANT
TOWEL OR 17X24 6PK STRL BLUE (TOWEL DISPOSABLE) ×8 IMPLANT
TRAY FOLEY CATH SILVER 14FR (SET/KITS/TRAYS/PACK) ×4 IMPLANT
TROCAR DILATING TIP 12MM 150MM (ENDOMECHANICALS) ×4 IMPLANT
TROCAR DISP BLADELESS 8 DVNC (TROCAR) ×2 IMPLANT
TROCAR DISP BLADELESS 8MM (TROCAR) ×2
TROCAR PORT AIRSEAL 5X120 (TROCAR) IMPLANT

## 2015-10-17 NOTE — Brief Op Note (Signed)
10/17/2015  2:42 PM  PATIENT:  [name]  51 y.o. female  PRE-OPERATIVE DIAGNOSIS:  OVARIAN CYST,RLQ pain  POST-OPERATIVE DIAGNOSIS:  OVARIAN CYST, RLQ pain, endometriosis, adhesions  PROCEDURE:  Procedure(s): ROBOTIC ASSISTED BILATERAL SALPINGO OOPHORECTOMY (Bilateral) ROBOTIC ASSISTED LAPAROSCOPIC LYSIS OF ADHESION, CAUTERY OF ENDOMETRIOSIS (N/A)  SURGEON:  Surgeon(s) and Role:    * Bobbye Charleston, MD - Primary    * Jerelyn Charles, MD - Assisting   ANESTHESIA:   general  EBL:  Total I/O In: 1000 [I.V.:1000] Out: 250 [Urine:200; Blood:50]   LOCAL MEDICATIONS USED:  BUPIVICAINE  and OTHER interceed  SPECIMEN:  Source of Specimen:  R and L tubes and ovaries, endometrial implant resections  DISPOSITION OF SPECIMEN:  PATHOLOGY  COUNTS:  YES  TOURNIQUET:  * No tourniquets in log *  DICTATION: .Note written in EPIC  PLAN OF CARE: Discharge to home after PACU  PATIENT DISPOSITION:  PACU - hemodynamically stable.   Delay start of Pharmacological VTE agent (>24hrs) due to surgical blood loss or risk of bleeding: not applicable  Complications:  None.  Findings:  3 cm R Ovary with adhesions to peritoneum.  Some adhesions of sigmoid to peritoneum.  3 cm LO with small cyst with adhesions to peritoneum and bowel.  There were 3 areas of endometriosis seen in and around the cul de dac.  The ureters were seen well out of all fields of dissection.  Meds: Interceed, bupivicaine  Technique:  After adequate general anesthesia was achieved, the patient was prepped and draped in sterile fashion.  . A sponge stick was placed in the vagina and a catheter placed to drain the bladder during the surgery.  Attention was turned to the abdomen and a  2 cm incision was made above the umbilicus.  The veress needle passed into the abdomen without aspiration of bowel contents or blood.  The 12 mm trocar for the camera port was introduced after insuflatation and the above findings noted.  Two  8.5 mm trocars were introduced 10 cm either side of the camera port under direct visualization.  The PK forceps were introduced on arm 2 and the Hot Shears on arm 1.      The adhesions of the peritoneum were removed from the ovary with cautery and the IP ligament identified.  The IP was isolated by incising the peritoneum above and parallel to it with the hot shears.  The IP was then cauterized in two places and incised with the scissors.  The ureter was seen well below th field of dissection.  The R ovary was then removed with careful cautery on the peritoneum that was adhesed to it, careful to be well away from the structures underneath.  The ovary was then placed in the cul de sac.  Hemostasis was assured with the insufflation down.    The LO was covered over by bowel and the bowel was attached to the vaginal cuff.  These adhesions were removed with the hot shears and the bowel retracted superiorly.  Dissection of the adhesions of the bowel to the tube and ovary was then performed with blunt and sharp dissection with the hot shears.  The adhesions of the bowel to the peritoneum were continued to be taken down until the bowel was retracted superiorly enough to expose the L IP ligament.  The ligament was isolated and cauterized in the same way as the R.  The ovary was then able to be peeled and dissected from the peritoneum with the  hot shears and the ureter was well out of the field of dissection.  Hemostasis of the peritoneal edges was ensured with the hot shears.  Interceed was placed over the raw areas of dissection. The Robot was then undocked.  The 8.5 mm scope was introduced and the ovaries and tubes were placed inside an endobag.  The bag was then brought through the umbilical incision and opened to the outside.  Ovaries and tubes were all successfully removed from the abdomen without any intraperitoneal spill and sent to pathology. Bupivicaine was then introduced into the pelvis.  All instruments  were removed and the abdomen desuflated.  The 12 mm trocar site fascia was closed with a figure of eight stitch of 2-Vicryl.  All skin incisions were closed with subcuticular stitches and Dermabond.  All instruments were withdrawn from the vagina.  Pt tolerated the procedure well and was returned to the recovery room in stable condition.    [name] A

## 2015-10-17 NOTE — Progress Notes (Signed)
There has been no change in the patients history, status or exam since the history and physical.  Filed Vitals:   10/17/15 0833  BP: 132/94  Pulse: 75  Temp: 98.1 F (36.7 C)  TempSrc: Oral  Resp: 20  SpO2: 99%    Lab Results  Component Value Date   WBC 9.0 10/09/2015   HGB 14.6 10/09/2015   HCT 42.7 10/09/2015   MCV 89.7 10/09/2015   PLT 212 10/09/2015    [name] A

## 2015-10-17 NOTE — Transfer of Care (Signed)
Immediate Anesthesia Transfer of Care Note  Patient: [name]  Procedure(s) Performed: Procedure(s): ROBOTIC ASSISTED BILATERAL SALPINGO OOPHORECTOMY (Bilateral) ROBOTIC ASSISTED LAPAROSCOPIC LYSIS OF ADHESION, CAUTERY OF ENDOMETRIOSIS (N/A)  Patient Location: PACU  Anesthesia Type:General  Level of Consciousness: awake, alert  and oriented  Airway & Oxygen Therapy: Patient Spontanous Breathing and Patient connected to nasal cannula oxygen  Post-op Assessment: Report given to RN, Post -op Vital signs reviewed and stable and Patient moving all extremities  Post vital signs: Reviewed and stable  Last Vitals:  Filed Vitals:   10/17/15 0833  BP: 132/94  Pulse: 75  Temp: 36.7 C  Resp: 20    Complications: No apparent anesthesia complications

## 2015-10-17 NOTE — Op Note (Signed)
10/17/2015  2:42 PM  PATIENT:  [name]  50 y.o. female  PRE-OPERATIVE DIAGNOSIS:  OVARIAN CYST,RLQ pain  POST-OPERATIVE DIAGNOSIS:  OVARIAN CYST, RLQ pain, endometriosis, adhesions  PROCEDURE:  Procedure(s): ROBOTIC ASSISTED BILATERAL SALPINGO OOPHORECTOMY (Bilateral) ROBOTIC ASSISTED LAPAROSCOPIC LYSIS OF ADHESION, CAUTERY OF ENDOMETRIOSIS (N/A)  SURGEON:  Surgeon(s) and Role:    * Bobbye Charleston, MD - Primary    * Jerelyn Charles, MD - Assisting   ANESTHESIA:   general  EBL:  Total I/O In: 1000 [I.V.:1000] Out: 250 [Urine:200; Blood:50]   LOCAL MEDICATIONS USED:  BUPIVICAINE  and OTHER interceed  SPECIMEN:  Source of Specimen:  R and L tubes and ovaries, endometrial implant resections  DISPOSITION OF SPECIMEN:  PATHOLOGY  COUNTS:  YES  TOURNIQUET:  * No tourniquets in log *  DICTATION: .Note written in EPIC  PLAN OF CARE: Discharge to home after PACU  PATIENT DISPOSITION:  PACU - hemodynamically stable.   Delay start of Pharmacological VTE agent (>24hrs) due to surgical blood loss or risk of bleeding: not applicable  Complications:  None.  Findings:  3 cm R Ovary with adhesions to peritoneum.  Some adhesions of sigmoid to peritoneum.  3 cm LO with small cyst with adhesions to peritoneum and bowel.  There were 3 areas of endometriosis seen in and around the cul de dac.  The ureters were seen well out of all fields of dissection.  Meds: Interceed, bupivicaine  Technique:  After adequate general anesthesia was achieved, the patient was prepped and draped in sterile fashion.  . A sponge stick was placed in the vagina and a catheter placed to drain the bladder during the surgery.  Attention was turned to the abdomen and a  2 cm incision was made above the umbilicus.  The veress needle passed into the abdomen without aspiration of bowel contents or blood.  The 12 mm trocar for the camera port was introduced after insuflatation and the above findings noted.  Two  8.5 mm trocars were introduced 10 cm either side of the camera port under direct visualization.  The PK forceps were introduced on arm 2 and the Hot Shears on arm 1.      The adhesions of the peritoneum were removed from the ovary with cautery and the IP ligament identified.  The IP was isolated by incising the peritoneum above and parallel to it with the hot shears.  The IP was then cauterized in two places and incised with the scissors.  The ureter was seen well below th field of dissection.  The R ovary was then removed with careful cautery on the peritoneum that was adhesed to it, careful to be well away from the structures underneath.  The ovary was then placed in the cul de sac.  Hemostasis was assured with the insufflation down.    The LO was covered over by bowel and the bowel was attached to the vaginal cuff.  These adhesions were removed with the hot shears and the bowel retracted superiorly.  Dissection of the adhesions of the bowel to the tube and ovary was then performed with blunt and sharp dissection with the hot shears.  The adhesions of the bowel to the peritoneum were continued to be taken down until the bowel was retracted superiorly enough to expose the L IP ligament.  The ligament was isolated and cauterized in the same way as the R.  The ovary was then able to be peeled and dissected from the peritoneum with the  hot shears and the ureter was well out of the field of dissection.  Hemostasis of the peritoneal edges was ensured with the hot shears.  Interceed was placed over the raw areas of dissection. The Robot was then undocked.  The 8.5 mm scope was introduced and the ovaries and tubes were placed inside an endobag.  The bag was then brought through the umbilical incision and opened to the outside.  Ovaries and tubes were all successfully removed from the abdomen without any intraperitoneal spill and sent to pathology. Bupivicaine was then introduced into the pelvis.  All instruments  were removed and the abdomen desuflated.  The 12 mm trocar site fascia was closed with a figure of eight stitch of 2-Vicryl.  All skin incisions were closed with subcuticular stitches and Dermabond.  All instruments were withdrawn from the vagina.  Pt tolerated the procedure well and was returned to the recovery room in stable condition.    Dazha Kempa A

## 2015-10-17 NOTE — Anesthesia Postprocedure Evaluation (Signed)
Anesthesia Post Note  Patient: [name]  Procedure(s) Performed: Procedure(s) (LRB): ROBOTIC ASSISTED BILATERAL SALPINGO OOPHORECTOMY (Bilateral) ROBOTIC ASSISTED LAPAROSCOPIC LYSIS OF ADHESION, CAUTERY OF ENDOMETRIOSIS (N/A)  Patient location during evaluation: PACU Anesthesia Type: General Level of consciousness: patient uncooperative Pain management: pain level controlled Vital Signs Assessment: post-procedure vital signs reviewed and stable Respiratory status: spontaneous breathing Cardiovascular status: stable Postop Assessment: no signs of nausea or vomiting Anesthetic complications: no    Last Vitals:  Filed Vitals:   10/17/15 1715 10/17/15 1730  BP: 121/79   Pulse: 82   Temp:  37.1 C  Resp: 15     Last Pain:  Filed Vitals:   10/17/15 1744  PainSc: [place]

## 2015-10-17 NOTE — Discharge Instructions (Signed)
DISCHARGE INSTRUCTIONS: Laparoscopy  The following instructions have been prepared to help you care for yourself upon your return home today.  Wound care:  Do not get the incision wet for the first 24 hours. The incision should be kept clean and dry.  The Band-Aids or dressings may be removed the day after surgery.  Should the incision become sore, red, and swollen after the first week, check with your doctor.  Personal hygiene:  Shower the day after your procedure.  Activity and limitations:  Do NOT drive or operate any equipment today.  Do NOT lift anything more than 15 pounds for 2-3 weeks after surgery.  Do NOT rest in bed all day.  Walking is encouraged. Walk each day, starting slowly with 5-minute walks 3 or 4 times a day. Slowly increase the length of your walks.  Walk up and down stairs slowly.  Do NOT do strenuous activities, such as golfing, playing tennis, bowling, running, biking, weight lifting, gardening, mowing, or vacuuming for 2-4 weeks. Ask your doctor when it is okay to start.  Diet: Eat a light meal as desired this evening. You may resume your usual diet tomorrow.  Return to work: This is dependent on the type of work you do. For the most part you can return to a desk job within a week of surgery. If you are more active at work, please discuss this with your doctor.  What to expect after your surgery: You may have a slight burning sensation when you urinate on the first day. You may have a very small amount of blood in the urine. Expect to have a small amount of vaginal discharge/light bleeding for 1-2 weeks. It is not unusual to have abdominal soreness and bruising for up to 2 weeks. You may be tired and need more rest for about 1 week. You may experience shoulder pain for 24-72 hours. Lying flat in bed may relieve it.  SCOPE PATCH MAY BE REMOVED ON OR BEFORE 10/20/15.  NO IBUPROFEN PRODUCTS OR ALEVE ON OR BEFORE 8:30 PM TODAY.    Call your doctor for any  of the following:  Develop a fever of 100.4 or greater  Inability to urinate 6 hours after discharge from hospital  Severe pain not relieved by pain medications  Persistent of heavy bleeding at incision site  Redness or swelling around incision site after a week  Increasing nausea or vomiting  Patient Signature________________________________________ Nurse Signature_________________________________________                         Post Anesthesia Home Care Instructions  Activity: Get plenty of rest for the remainder of the day. A responsible adult should stay with you for 24 hours following the procedure.  For the next 24 hours, DO NOT: -Drive a car -Paediatric nurse -Drink alcoholic beverages -Take any medication unless instructed by your physician -Make any legal decisions or sign important papers.  Meals: Start with liquid foods such as gelatin or soup. Progress to regular foods as tolerated. Avoid greasy, spicy, heavy foods. If nausea and/or vomiting occur, drink only clear liquids until the nausea and/or vomiting subsides. Call your physician if vomiting continues.  Special Instructions/Symptoms: Your throat may feel dry or sore from the anesthesia or the breathing tube placed in your throat during surgery. If this causes discomfort, gargle with warm salt water. The discomfort should disappear within 24 hours.  I

## 2015-10-17 NOTE — Anesthesia Procedure Notes (Signed)
Procedure Name: Intubation Date/Time: 10/17/2015 1:07 PM Performed by: Hewitt Blade Pre-anesthesia Checklist: Patient identified, Emergency Drugs available, Suction available and Patient being monitored Patient Re-evaluated:Patient Re-evaluated prior to inductionOxygen Delivery Method: Circle system utilized Preoxygenation: Pre-oxygenation with 100% oxygen Intubation Type: IV induction Ventilation: Mask ventilation without difficulty Laryngoscope Size: Mac and 3 Grade View: Grade I Tube type: Oral Tube size: 7.0 mm Number of attempts: 1 Airway Equipment and Method: Stylet Placement Confirmation: ETT inserted through vocal cords under direct vision,  positive ETCO2 and breath sounds checked- equal and bilateral Secured at: 21 cm Tube secured with: Tape Dental Injury: Teeth and Oropharynx as per pre-operative assessment

## 2015-10-18 ENCOUNTER — Encounter (HOSPITAL_COMMUNITY): Payer: Self-pay | Admitting: Obstetrics and Gynecology

## 2015-11-09 ENCOUNTER — Encounter (HOSPITAL_COMMUNITY): Payer: Self-pay | Admitting: Anesthesiology

## 2015-11-09 ENCOUNTER — Inpatient Hospital Stay (HOSPITAL_COMMUNITY)
Admission: AD | Admit: 2015-11-09 | Discharge: 2015-11-09 | Disposition: A | Discharge status: Home / Self Care | Payer: BLUE CROSS/BLUE SHIELD | Source: Ambulatory Visit | Attending: Obstetrics and Gynecology | Admitting: Obstetrics and Gynecology

## 2015-11-09 ENCOUNTER — Inpatient Hospital Stay (HOSPITAL_COMMUNITY): Payer: BLUE CROSS/BLUE SHIELD

## 2015-11-09 ENCOUNTER — Encounter (HOSPITAL_COMMUNITY): Payer: Self-pay | Admitting: *Deleted

## 2015-11-09 DIAGNOSIS — Z9079 Acquired absence of other genital organ(s): Secondary | ICD-10-CM | POA: Diagnosis not present

## 2015-11-09 DIAGNOSIS — M545 Low back pain: Secondary | ICD-10-CM | POA: Insufficient documentation

## 2015-11-09 DIAGNOSIS — Z87891 Personal history of nicotine dependence: Secondary | ICD-10-CM | POA: Insufficient documentation

## 2015-11-09 DIAGNOSIS — Z90722 Acquired absence of ovaries, bilateral: Secondary | ICD-10-CM | POA: Insufficient documentation

## 2015-11-09 DIAGNOSIS — K660 Peritoneal adhesions (postprocedural) (postinfection): Secondary | ICD-10-CM | POA: Insufficient documentation

## 2015-11-09 DIAGNOSIS — Z79899 Other long term (current) drug therapy: Secondary | ICD-10-CM | POA: Diagnosis not present

## 2015-11-09 DIAGNOSIS — Z91048 Other nonmedicinal substance allergy status: Secondary | ICD-10-CM | POA: Diagnosis not present

## 2015-11-09 DIAGNOSIS — Z882 Allergy status to sulfonamides status: Secondary | ICD-10-CM | POA: Insufficient documentation

## 2015-11-09 DIAGNOSIS — K5090 Crohn's disease, unspecified, without complications: Secondary | ICD-10-CM | POA: Insufficient documentation

## 2015-11-09 DIAGNOSIS — G8929 Other chronic pain: Secondary | ICD-10-CM | POA: Insufficient documentation

## 2015-11-09 DIAGNOSIS — Z79891 Long term (current) use of opiate analgesic: Secondary | ICD-10-CM | POA: Insufficient documentation

## 2015-11-09 DIAGNOSIS — G8918 Other acute postprocedural pain: Secondary | ICD-10-CM

## 2015-11-09 LAB — URINALYSIS, ROUTINE W REFLEX MICROSCOPIC
Bilirubin Urine: NEGATIVE
GLUCOSE, UA: NEGATIVE mg/dL
Ketones, ur: NEGATIVE mg/dL
LEUKOCYTES UA: NEGATIVE
Nitrite: NEGATIVE
PH: 6.5 (ref 5.0–8.0)
Protein, ur: NEGATIVE mg/dL
Specific Gravity, Urine: <1.005 — ABNORMAL LOW (ref 1.005–1.030)

## 2015-11-09 LAB — CBC WITH DIFFERENTIAL/PLATELET
Basophils Absolute: 0 10*3/uL (ref 0.0–0.1)
Basophils Relative: 0 %
EOS PCT: 7 %
Eosinophils Absolute: 0.5 10*3/uL (ref 0.0–0.7)
HEMATOCRIT: 41.5 % (ref 36.0–46.0)
Hemoglobin: 14.2 g/dL (ref 12.0–15.0)
LYMPHS ABS: 2.7 10*3/uL (ref 0.7–4.0)
LYMPHS PCT: 36 %
MCH: 30.5 pg (ref 26.0–34.0)
MCHC: 34.2 g/dL (ref 30.0–36.0)
MCV: 89.2 fL (ref 78.0–100.0)
MONO ABS: 0.3 10*3/uL (ref 0.1–1.0)
MONOS PCT: 4 %
Neutro Abs: 4 10*3/uL (ref 1.7–7.7)
Neutrophils Relative %: 53 %
Platelets: 216 10*3/uL (ref 150–400)
RBC: 4.65 MIL/uL (ref 3.87–5.11)
RDW: 14.3 % (ref 11.5–15.5)
WBC: 7.5 10*3/uL (ref 4.0–10.5)

## 2015-11-09 LAB — URINE MICROSCOPIC-ADD ON: Bacteria, UA: NONE SEEN

## 2015-11-09 MED ORDER — IOHEXOL 300 MG/ML  SOLN
100.0000 mL | Freq: Once | INTRAMUSCULAR | Status: AC | PRN
  Administered 2015-11-09: 100 mL via INTRAVENOUS

## 2015-11-09 MED ORDER — HYDROMORPHONE HCL 1 MG/ML IJ SOLN: 1.0000 mg | Freq: Once | INTRAMUSCULAR | Status: DC

## 2015-11-09 MED ORDER — SODIUM CHLORIDE 0.9 % IV SOLN
INTRAVENOUS | Status: DC
  Administered 2015-11-09: 12:00:00 via INTRAVENOUS

## 2015-11-09 MED ORDER — HYDROMORPHONE HCL 1 MG/ML IJ SOLN
1.0000 mg | Freq: Once | INTRAMUSCULAR | Status: AC
  Administered 2015-11-09: 1 mg via INTRAVENOUS
  Filled 2015-11-09: qty 1

## 2015-11-09 MED ORDER — IOHEXOL 300 MG/ML  SOLN: 50.0000 mL | INTRAMUSCULAR | Status: AC

## 2015-11-09 NOTE — MAU Note (Signed)
Had robotic surgery on 3/27, incision check very painful, oozing blood, feels a knot.

## 2015-11-09 NOTE — Discharge Instructions (Signed)
Laparoscopic Lysis of Abdominal Adhesions, Care After Refer to this sheet in the next few weeks. These instructions provide you with information about caring for yourself after your procedure. Your health care provider may also give you more specific instructions. Your treatment has been planned according to current medical practices, but problems sometimes occur. Call your health care provider if you have any problems or questions after your procedure. WHAT TO EXPECT AFTER THE PROCEDURE After your procedure, it is common to have some pain around the incision. HOME CARE INSTRUCTIONS  Take medicines only as directed by your health care provider.  You may need to start by eating only a little at a time. Start with liquids. As your appetite improves, gradually return to eating solid foods.  Do not lift anything that is heavier than 10 lb (4.5 kg) for four weeks.  Return to your normal activities as directed by your health care provider. Ask your health care provider what activities are safe for you.  There are many different ways to close and cover an incision, including stitches (sutures), skin glue, and adhesive strips. Follow instructions from your health care provider about:  Incision care.  Bandage (dressing) changes and removal.  Incision closure removal.  Check your incisions every day for signs of infection. Watch for:  Redness, swelling, or pain.  Fluid , blood, or pus. SEEK MEDICAL CARE IF:  You develop a fever or chills.  You have redness, swelling, or pain at the site of your incisions.  You have fluid, blood, or pus coming from your incisions.  There is a bad smell coming from your incisions or the dressing.  Your pain gets worse.  You have a cough.  You have nausea and vomiting that does not go away after 3 hours. SEEK IMMEDIATE MEDICAL CARE IF:  You develop severe pain in your abdomen or your chest.  You develop shortness of breath.  You develop nausea or  vomiting that is severe or keeps coming back.   This information is not intended to replace advice given to you by your health care provider. Make sure you discuss any questions you have with your health care provider.   Document Released: 11/28/2014 Document Reviewed: 11/28/2014 Elsevier Interactive Patient Education [employer].

## 2015-11-09 NOTE — MAU Provider Note (Signed)
History     CSN: [id number]  Arrival date and time: 11/09/15 1023   First Provider Initiated Contact with Patient 11/09/15 1042      Chief Complaint  Patient presents with  . Wound Check  . Abdominal Pain   HPI [name] is a 50 y.o. [id number] who is POD # 23 after bilateral salpingectomy-oophorectomy and lysis of adhesions for endometriosis presents to MAU today with complaint of abdominal pain. The patient had the procedure on 10/17/15 performed at [facility] by Dr. Philis Pique. She was taking Percocet for pain which was helping until the last 2 days. She states last Percocet was taking last night. She states "fever" at night. Last night it was 99.9 F and the night before 100.1 F. She states bleeding and pus from the incision site with foul odor. She called the office on Wednesday and was started on Augmentin. She is also concerned because she feels that her abdomen is swollen.    OB History    Gravida Para Term Preterm AB TAB SAB Ectopic Multiple Living   0 0 0 0 0 0 0 0 0 0       Past Medical History  Diagnosis Date  . DDD (degenerative disc disease), lumbar   . Hearing loss     left  . Tension headache   . Bulging lumbar disc   . Crohn's disease ([place])   . Chronic lower back pain   . Wears hearing aid     left ear  . TMJ syndrome   . Dental crowns present   . Seasonal allergies   . Nasal bone fracture 04/2015    Past Surgical History  Procedure Laterality Date  . Tonsillectomy      age 15  . Bunionectomy    . Appendectomy    . Stapedectomy Left   . Knee arthroscopy    . Breast enhancement surgery  1998  . Orif finger fracture Right     middle finger  . Finger surgery Right     middle finger - x 2 others besides ORIF  . Abdominal hysterectomy  10/23/2005    partial  . Closed reduction nasal fracture N/A 05/14/2015    Procedure: CLOSED REDUCTION NASAL FRACTURE;  Surgeon: Leta Baptist, MD;  Location: [place];  Service: ENT;  Laterality: N/A;  .  Robotic assisted bilateral salpingo oopherectomy Bilateral 10/17/2015    Procedure: ROBOTIC ASSISTED BILATERAL SALPINGO OOPHORECTOMY;  Surgeon: Bobbye Charleston, MD;  Location: [facility];  Service: Gynecology;  Laterality: Bilateral;  . Robotic assisted laparoscopic lysis of adhesion N/A 10/17/2015    Procedure: ROBOTIC ASSISTED LAPAROSCOPIC LYSIS OF ADHESION, CAUTERY OF ENDOMETRIOSIS;  Surgeon: Bobbye Charleston, MD;  Location: [facility];  Service: Gynecology;  Laterality: N/A;    Family History  Problem Relation Age of Onset  . Alzheimer's disease Mother     Social History  Substance Use Topics  . Smoking status: Former Smoker -- 2.00 packs/day    Types: Cigarettes    Quit date: 08/01/2008  . Smokeless tobacco: Never Used     Comment: 1 cig./day  . Alcohol Use: Yes     Comment: occasionally    Allergies:  Allergies  Allergen Reactions  . Sulfa Antibiotics Itching    Prescriptions prior to admission  Medication Sig Dispense Refill Last Dose  . amoxicillin-clavulanate (AUGMENTIN) 875-125 MG tablet Take 1 tablet by mouth 2 (two) times daily.   11/09/2015 at Unknown time  . Ascorbic Acid (VITAMIN C PO) Take  1 tablet by mouth daily.   11/08/2015 at Unknown time  . CALCIUM PO Take 2 tablets by mouth daily.   11/08/2015 at Unknown time  . Cholecalciferol (VITAMIN D3) 5000 units TABS Take 2 tablets by mouth daily.   11/08/2015 at Unknown time  . CHOLINE PO Take 2 tablets by mouth daily.   11/08/2015 at Unknown time  . gabapentin (NEURONTIN) 300 MG capsule Take 300 mg by mouth 2 (two) times daily.  2 11/08/2015 at Unknown time  . glucosamine-chondroitin 500-400 MG tablet Take 2 tablets by mouth at bedtime. With Tumeric and magnesium   11/08/2015 at Unknown time  . HYDROcodone-acetaminophen (NORCO/VICODIN) 5-325 MG tablet Take 1 tablet by mouth 2 (two) times daily as needed. For back pain  0 Past Month at Unknown time  . Omega-3 Fatty Acids (FISH OIL) 1000 MG CAPS Take 2 capsules by mouth daily.    11/08/2015 at Unknown time  . oxyCODONE (ROXICODONE) 15 MG immediate release tablet TAKE 1 TABLET TWICE DAILY AS NEEDED FOR PAIN  0 11/08/2015 at Unknown time  . oxyCODONE-acetaminophen (PERCOCET/ROXICET) 5-325 MG tablet Take 2 tablets by mouth every 6 (six) hours as needed for severe pain. 30 tablet 0 Past Month at Unknown time  . Thiamine HCl (THIAMINE PO) Take 2 tablets by mouth daily.   11/08/2015 at Unknown time  . TURMERIC PO Take 2 tablets by mouth daily.    11/08/2015 at Unknown time  . zolpidem (AMBIEN CR) 12.5 MG CR tablet Take 12.5 mg by mouth every 3 (three) days as needed for sleep.   0 11/08/2015 at Unknown time  . ALPRAZolam (XANAX) 0.25 MG tablet Take 0.25 mg by mouth daily as needed for anxiety.   prn  . cyclobenzaprine (FLEXERIL) 10 MG tablet Take 10 mg by mouth 3 (three) times daily as needed for muscle spasms. Reported on 11/09/2015   Completed Course at Unknown time    Review of Systems  Constitutional: Negative for fever and malaise/fatigue.  Gastrointestinal: Positive for abdominal pain. Negative for nausea, vomiting, diarrhea and constipation.  Genitourinary: Negative for dysuria, urgency and frequency.   Physical Exam   Blood pressure 174/84, pulse 76, temperature 98.2 F (36.8 C), temperature source Oral, resp. rate 16, height 5' 7"  (1.702 m), weight 157 lb (71.215 kg).  Physical Exam  Nursing note and vitals reviewed. Constitutional: She is oriented to person, place, and time. She appears well-developed and well-nourished. No distress.  HENT:  Head: Normocephalic and atraumatic.  Cardiovascular: Normal rate.   Respiratory: Effort normal.  GI: Soft. She exhibits no distension and no mass. There is tenderness (exsquisite tenderness to palpation of the peri-umbilical region). There is no rebound and no guarding.    Neurological: She is alert and oriented to person, place, and time.  Skin: Skin is warm and dry. No erythema.  Psychiatric: Her behavior is normal. Her  mood appears anxious. She does not exhibit a depressed mood.    Results for orders placed or performed during the hospital encounter of 11/09/15 (from the past 24 hour(s))  Urinalysis, Routine w reflex microscopic (not at [facility])     Status: Abnormal   Collection Time: 11/09/15 10:30 AM  Result Value Ref Range   Color, Urine STRAW (A) YELLOW   APPearance CLEAR CLEAR   Specific Gravity, Urine <1.005 (L) 1.005 - 1.030   pH 6.5 5.0 - 8.0   Glucose, UA NEGATIVE NEGATIVE mg/dL   Hgb urine dipstick TRACE (A) NEGATIVE   Bilirubin Urine NEGATIVE NEGATIVE  Ketones, ur NEGATIVE NEGATIVE mg/dL   Protein, ur NEGATIVE NEGATIVE mg/dL   Nitrite NEGATIVE NEGATIVE   Leukocytes, UA NEGATIVE NEGATIVE  Urine microscopic-add on     Status: Abnormal   Collection Time: 11/09/15 10:30 AM  Result Value Ref Range   Squamous Epithelial / LPF 0-5 (A) NONE SEEN   WBC, UA 0-5 0 - 5 WBC/hpf   RBC / HPF 0-5 0 - 5 RBC/hpf   Bacteria, UA NONE SEEN NONE SEEN  CBC with Differential/Platelet     Status: None   Collection Time: 11/09/15 10:47 AM  Result Value Ref Range   WBC 7.5 4.0 - 10.5 K/uL   RBC 4.65 3.87 - 5.11 MIL/uL   Hemoglobin 14.2 12.0 - 15.0 g/dL   HCT 41.5 36.0 - 46.0 %   MCV 89.2 78.0 - 100.0 fL   MCH 30.5 26.0 - 34.0 pg   MCHC 34.2 30.0 - 36.0 g/dL   RDW 14.3 11.5 - 15.5 %   Platelets 216 150 - 400 K/uL   Neutrophils Relative % 53 %   Neutro Abs 4.0 1.7 - 7.7 K/uL   Lymphocytes Relative 36 %   Lymphs Abs 2.7 0.7 - 4.0 K/uL   Monocytes Relative 4 %   Monocytes Absolute 0.3 0.1 - 1.0 K/uL   Eosinophils Relative 7 %   Eosinophils Absolute 0.5 0.0 - 0.7 K/uL   Basophils Relative 0 %   Basophils Absolute 0.0 0.0 - 0.1 K/uL   Ct Abdomen Pelvis W Contrast  11/09/2015  CLINICAL DATA:  Postoperative pain, underwent BILATERAL salpingo-oophorectomy and lysis of adhesions on 10/17/2015, redness and purulent drainage from umbilical incision, periumbilical pain since 84/66/5993, prior hysterectomy,  appendectomy, history Crohn's disease, former smoker EXAM: CT ABDOMEN AND PELVIS WITH CONTRAST TECHNIQUE: Multidetector CT imaging of the abdomen and pelvis was performed using the standard protocol following bolus administration of intravenous contrast Sagittal and coronal MPR images reconstructed from axial data set. CONTRAST:  146m OMNIPAQUE IOHEXOL 300 MG/ML SOLN IV. Dilute oral contrast. COMPARISON:  01/18/2015 FINDINGS: Lung bases clear. Inferior margins of BILATERAL breast prostheses noted. Small cyst inferior RIGHT kidney 17 x 14 mm image 33. Liver, spleen, pancreas, kidneys and adrenal glands otherwise normal appearance. Uterus, ovaries, and appendix surgically absent by history. Unremarkable bladder and ureters. Stranding in pelvis consistent with preceding surgery. No abnormal gas or fluid collections identified. Stippled radio opacities within stool of the colon likely ingested radiopaque foreign bodies. Stomach and bowel loops otherwise normal in appearance. No mass, adenopathy, free air or free fluid. Osseous structures unremarkable. IMPRESSION: Expected postsurgical changes in pelvis. No acute abnormalities. Electronically Signed   By: MLavonia DanaM.D.   On: 11/09/2015 13:36    MAU Course  Procedures None  MDM CBC today Discussed patient with Dr. CRogue Bussing Recommends CT scan today to ensure to abscess formation post op Discussed CT results with Dr. CRogue Bussing [place] discharge at this time. Recommends stool softener and continue Augmentin and Percocet PRN as previously directed.   Assessment and Plan  A: S/P BSO and lysis of adhesions Post operative pain   P: Discharge home Continue previously prescribed medication Warning signs for worsening condition discussed Patient advised to follow-up with Dr. HPhilis Piqueas scheduled for routine post operative visit or sooner if symptoms persist or worsen Patient may return to MAU as needed or if her condition were to change or worsen  JLuvenia Redden PA-C  11/09/2015, 2:04 PM

## 2015-11-12 ENCOUNTER — Emergency Department (HOSPITAL_COMMUNITY): Payer: BLUE CROSS/BLUE SHIELD

## 2015-11-12 ENCOUNTER — Encounter (HOSPITAL_COMMUNITY): Payer: Self-pay

## 2015-11-12 ENCOUNTER — Emergency Department (HOSPITAL_COMMUNITY)
Admission: EM | Admit: 2015-11-12 | Discharge: 2015-11-12 | Disposition: A | Discharge status: Home / Self Care | Payer: BLUE CROSS/BLUE SHIELD | Attending: Emergency Medicine | Admitting: Emergency Medicine

## 2015-11-12 DIAGNOSIS — Z9049 Acquired absence of other specified parts of digestive tract: Secondary | ICD-10-CM | POA: Insufficient documentation

## 2015-11-12 DIAGNOSIS — Z87891 Personal history of nicotine dependence: Secondary | ICD-10-CM | POA: Insufficient documentation

## 2015-11-12 DIAGNOSIS — Z792 Long term (current) use of antibiotics: Secondary | ICD-10-CM | POA: Diagnosis not present

## 2015-11-12 DIAGNOSIS — G8918 Other acute postprocedural pain: Secondary | ICD-10-CM

## 2015-11-12 DIAGNOSIS — Z79899 Other long term (current) drug therapy: Secondary | ICD-10-CM | POA: Insufficient documentation

## 2015-11-12 DIAGNOSIS — Z974 Presence of external hearing-aid: Secondary | ICD-10-CM | POA: Diagnosis not present

## 2015-11-12 DIAGNOSIS — Z8781 Personal history of (healed) traumatic fracture: Secondary | ICD-10-CM | POA: Diagnosis not present

## 2015-11-12 DIAGNOSIS — Z8719 Personal history of other diseases of the digestive system: Secondary | ICD-10-CM | POA: Insufficient documentation

## 2015-11-12 DIAGNOSIS — Z8739 Personal history of other diseases of the musculoskeletal system and connective tissue: Secondary | ICD-10-CM | POA: Insufficient documentation

## 2015-11-12 DIAGNOSIS — R109 Unspecified abdominal pain: Secondary | ICD-10-CM | POA: Diagnosis not present

## 2015-11-12 DIAGNOSIS — H9192 Unspecified hearing loss, left ear: Secondary | ICD-10-CM | POA: Insufficient documentation

## 2015-11-12 DIAGNOSIS — G8929 Other chronic pain: Secondary | ICD-10-CM | POA: Insufficient documentation

## 2015-11-12 DIAGNOSIS — Z98811 Dental restoration status: Secondary | ICD-10-CM | POA: Diagnosis not present

## 2015-11-12 DIAGNOSIS — Z4801 Encounter for change or removal of surgical wound dressing: Secondary | ICD-10-CM | POA: Diagnosis present

## 2015-11-12 LAB — URINALYSIS, ROUTINE W REFLEX MICROSCOPIC
Bilirubin Urine: NEGATIVE
GLUCOSE, UA: NEGATIVE mg/dL
HGB URINE DIPSTICK: NEGATIVE
Ketones, ur: NEGATIVE mg/dL
Leukocytes, UA: NEGATIVE
Nitrite: NEGATIVE
Protein, ur: NEGATIVE mg/dL
SPECIFIC GRAVITY, URINE: 1.006 (ref 1.005–1.030)
pH: 7 (ref 5.0–8.0)

## 2015-11-12 LAB — CBC
HCT: 44.8 % (ref 36.0–46.0)
HEMOGLOBIN: 15.2 g/dL — AB (ref 12.0–15.0)
MCH: 30.4 pg (ref 26.0–34.0)
MCHC: 33.9 g/dL (ref 30.0–36.0)
MCV: 89.6 fL (ref 78.0–100.0)
Platelets: 216 10*3/uL (ref 150–400)
RBC: 5 MIL/uL (ref 3.87–5.11)
RDW: 13.9 % (ref 11.5–15.5)
WBC: 6.3 10*3/uL (ref 4.0–10.5)

## 2015-11-12 LAB — COMPREHENSIVE METABOLIC PANEL
ALT: 13 U/L — ABNORMAL LOW (ref 14–54)
ANION GAP: 7 (ref 5–15)
AST: 23 U/L (ref 15–41)
Albumin: 4.5 g/dL (ref 3.5–5.0)
Alkaline Phosphatase: 46 U/L (ref 38–126)
BUN: 7 mg/dL (ref 6–20)
CHLORIDE: 105 mmol/L (ref 101–111)
CO2: 28 mmol/L (ref 22–32)
Calcium: 9.1 mg/dL (ref 8.9–10.3)
Creatinine, Ser: 0.6 mg/dL (ref 0.44–1.00)
GFR calc non Af Amer: >60 mL/min (ref >60)
Glucose, Bld: 82 mg/dL (ref 65–99)
Potassium: 3.7 mmol/L (ref 3.5–5.1)
SODIUM: 140 mmol/L (ref 135–145)
Total Bilirubin: 0.5 mg/dL (ref 0.3–1.2)
Total Protein: 7.5 g/dL (ref 6.5–8.1)

## 2015-11-12 LAB — LIPASE, BLOOD: LIPASE: 17 U/L (ref 11–51)

## 2015-11-12 MED ORDER — IOPAMIDOL (ISOVUE-300) INJECTION 61%
100.0000 mL | Freq: Once | INTRAVENOUS | Status: AC | PRN
  Administered 2015-11-12: 100 mL via INTRAVENOUS

## 2015-11-12 MED ORDER — OXYCODONE-ACETAMINOPHEN 5-325 MG PO TABS: 1.0000 | ORAL_TABLET | ORAL | Status: DC | PRN

## 2015-11-12 MED ORDER — ONDANSETRON HCL 4 MG PO TABS: 4.0000 mg | ORAL_TABLET | Freq: Four times a day (QID) | ORAL | Status: DC

## 2015-11-12 MED ORDER — OXYCODONE-ACETAMINOPHEN 5-325 MG PO TABS
2.0000 | ORAL_TABLET | Freq: Once | ORAL | Status: AC
  Administered 2015-11-12: 2 via ORAL
  Filled 2015-11-12: qty 2

## 2015-11-12 MED ORDER — MORPHINE SULFATE (PF) 4 MG/ML IV SOLN
4.0000 mg | Freq: Once | INTRAVENOUS | Status: AC
  Administered 2015-11-12: 4 mg via INTRAVENOUS
  Filled 2015-11-12: qty 1

## 2015-11-12 MED ORDER — DIATRIZOATE MEGLUMINE & SODIUM 66-10 % PO SOLN
30.0000 mL | Freq: Once | ORAL | Status: AC
  Administered 2015-11-12: 30 mL via ORAL
  Filled 2015-11-12: qty 30

## 2015-11-12 MED ORDER — ONDANSETRON HCL 4 MG/2ML IJ SOLN
4.0000 mg | Freq: Once | INTRAMUSCULAR | Status: AC
  Administered 2015-11-12: 4 mg via INTRAVENOUS
  Filled 2015-11-12: qty 2

## 2015-11-12 NOTE — ED Notes (Signed)
Pt presents for a wound check and c/o abdominal pain x 5 days.  Pain score 8/10.  Pt had robotic oophorectomy on 3/22.  Wound in umbilicus is draining and painful.  Pt was placed on Augmentin x 5 days ago and seen at [facility] x 3 days ago for same.  Pt had a negative CT.

## 2015-11-12 NOTE — ED Provider Notes (Signed)
CSN: [id number]     Arrival date & time 11/12/15  1040 History   First MD Initiated Contact with Patient 11/12/15 1215     Chief Complaint  Patient presents with  . Post-op Problem  . Wound Check   HPI  [name] is a 50 year old female presenting for a wound check. Patient had laparoscopic oophrectomy on 3/22. She reports no immediate complications from the surgery. She states that last week her umbilicus developed a knot and became extremely tender to palpation. She states that her umbilicus is draining purulent malodorous discharge. She called her surgeon who placed her on Augmentin which she reports compliance with. She was also seen at the [facility] 3 days ago for the same complaint. Patient had negative CT abdomen at the time and was discharged home with outpatient follow-up. She presents today because her surgeon is out of the office and she feels that her symptoms have not improved over the weekend. Her umbilicus is still draining purulent fluid in her abdomen is extremely tender to palpation. She denies redness around the surgical site but does note a mild skin reaction to the bandage that was placed over her wound. She denies fever, chills, dizziness, syncope, vomiting or diarrhea. She does endorse mild nausea.  Past Medical History  Diagnosis Date  . DDD (degenerative disc disease), lumbar   . Hearing loss     left  . Tension headache   . Bulging lumbar disc   . Crohn's disease ([place])   . Chronic lower back pain   . Wears hearing aid     left ear  . TMJ syndrome   . Dental crowns present   . Seasonal allergies   . Nasal bone fracture 04/2015   Past Surgical History  Procedure Laterality Date  . Tonsillectomy      age 17  . Bunionectomy    . Appendectomy    . Stapedectomy Left   . Knee arthroscopy    . Breast enhancement surgery  1998  . Orif finger fracture Right     middle finger  . Finger surgery Right     middle finger - x 2 others besides ORIF  . Abdominal  hysterectomy  10/23/2005    partial  . Closed reduction nasal fracture N/A 05/14/2015    Procedure: CLOSED REDUCTION NASAL FRACTURE;  Surgeon: Leta Baptist, MD;  Location: [place];  Service: ENT;  Laterality: N/A;  . Robotic assisted bilateral salpingo oopherectomy Bilateral 10/17/2015    Procedure: ROBOTIC ASSISTED BILATERAL SALPINGO OOPHORECTOMY;  Surgeon: Bobbye Charleston, MD;  Location: [facility];  Service: Gynecology;  Laterality: Bilateral;  . Robotic assisted laparoscopic lysis of adhesion N/A 10/17/2015    Procedure: ROBOTIC ASSISTED LAPAROSCOPIC LYSIS OF ADHESION, CAUTERY OF ENDOMETRIOSIS;  Surgeon: Bobbye Charleston, MD;  Location: [facility];  Service: Gynecology;  Laterality: N/A;   Family History  Problem Relation Age of Onset  . Alzheimer's disease Mother    Social History  Substance Use Topics  . Smoking status: Former Smoker -- 2.00 packs/day    Types: Cigarettes    Quit date: 08/01/2008  . Smokeless tobacco: Never Used     Comment: 1 cig./day  . Alcohol Use: Yes     Comment: occasionally   OB History    Gravida Para Term Preterm AB TAB SAB Ectopic Multiple Living   0 0 0 0 0 0 0 0 0 0      Review of Systems  All other systems reviewed and are negative.  Allergies  Sulfa antibiotics  Home Medications   Prior to Admission medications   Medication Sig Start Date End Date Taking? Authorizing Provider  ALPRAZolam Duanne Moron) 0.25 MG tablet Take 0.25 mg by mouth daily as needed for anxiety.   Yes Historical Provider, MD  amoxicillin-clavulanate (AUGMENTIN) 875-125 MG tablet Take 1 tablet by mouth 2 (two) times daily. 11/07/15  Yes Historical Provider, MD  Ascorbic Acid (VITAMIN C PO) Take 1 tablet by mouth daily.   Yes Historical Provider, MD  CALCIUM PO Take 2 tablets by mouth daily.   Yes Historical Provider, MD  Cholecalciferol (VITAMIN D3) 5000 units TABS Take 2 tablets by mouth daily.   Yes Historical Provider, MD  CHOLINE PO Take 2 tablets by mouth daily.    Yes Historical Provider, MD  gabapentin (NEURONTIN) 300 MG capsule Take 300 mg by mouth 2 (two) times daily. 09/07/15  Yes Historical Provider, MD  glucosamine-chondroitin 500-400 MG tablet Take 2 tablets by mouth at bedtime. With Tumeric and magnesium   Yes Historical Provider, MD  HYDROcodone-acetaminophen (NORCO/VICODIN) 5-325 MG tablet Take 1 tablet by mouth 2 (two) times daily as needed. For back pain 10/30/15  Yes Historical Provider, MD  Omega-3 Fatty Acids (FISH OIL) 1000 MG CAPS Take 2 capsules by mouth daily.   Yes Historical Provider, MD  OVER THE COUNTER MEDICATION Take 1 scoop by mouth 2 (two) times daily. monolaurin powder   Yes Historical Provider, MD  Thiamine HCl (THIAMINE PO) Take 2 tablets by mouth daily.   Yes Historical Provider, MD  TURMERIC PO Take 2 tablets by mouth daily.    Yes Historical Provider, MD  zolpidem (AMBIEN CR) 12.5 MG CR tablet Take 12.5 mg by mouth every 3 (three) days as needed for sleep.  09/07/15  Yes Historical Provider, MD  ondansetron (ZOFRAN) 4 MG tablet Take 1 tablet (4 mg total) by mouth every 6 (six) hours. 11/12/15   Darshana Curnutt, PA-C  oxyCODONE-acetaminophen (PERCOCET/ROXICET) 5-325 MG tablet Take 2 tablets by mouth every 6 (six) hours as needed for severe pain. 10/17/15   Bobbye Charleston, MD   BP 140/89 mmHg  Pulse 78  Temp(Src) 98.8 F (37.1 C) (Oral)  Resp 20  SpO2 100% Physical Exam  Constitutional: She appears well-developed and well-nourished. No distress.  HENT:  Head: Normocephalic and atraumatic.  Right Ear: External ear normal.  Left Ear: External ear normal.  Eyes: Conjunctivae are normal. Right eye exhibits no discharge. Left eye exhibits no discharge. No scleral icterus.  Neck: Normal range of motion.  Cardiovascular: Normal rate.   Pulmonary/Chest: Effort normal.  Abdominal: Soft. There is tenderness.  Multiple well healing incision sites noted over abdomen. Umbilicus with scant amount of white, malodorous drainage. Faint  erythema of umbilicus noted without streaking. Area is extremely TTP. Small, firm nodule felt at umbilicus. No fluctuance or induration. Surrounding erythema in distribution of rectangular dressing that was covering the wound.   Musculoskeletal: Normal range of motion.  Moves all extremities spontaneously  Neurological: She is alert. Coordination normal.  Skin: Skin is warm and dry.  Psychiatric: She has a normal mood and affect. Her behavior is normal.  Nursing note and vitals reviewed.   ED Course  Procedures (including critical care time) Labs Review Labs Reviewed  COMPREHENSIVE METABOLIC PANEL - Abnormal; Notable for the following:    ALT 13 (*)    All other components within normal limits  CBC - Abnormal; Notable for the following:    Hemoglobin 15.2 (*)  All other components within normal limits  URINALYSIS, ROUTINE W REFLEX MICROSCOPIC (NOT AT [facility])  LIPASE, BLOOD    Imaging Review Ct Abdomen Pelvis W Contrast  11/12/2015  CLINICAL DATA:  Recent salpingooophorectomy and lysis of adhesions. Hysterectomy 1 month prior. Pus from surgical site. Pain. EXAM: CT ABDOMEN AND PELVIS WITH CONTRAST TECHNIQUE: Multidetector CT imaging of the abdomen and pelvis was performed using the standard protocol following bolus administration of intravenous contrast. Oral contrast was also administered. CONTRAST:  139m ISOVUE-300 IOPAMIDOL (ISOVUE-300) INJECTION 61% COMPARISON:  November 09, 2015 FINDINGS: Lower chest: Lung bases are clear. Breast implants noted bilaterally. Hepatobiliary: Liver is prominent, measuring 19.6 cm in length. No focal liver lesions are identified. The gallbladder wall is not thickened. There is common bile duct prominence with a measured diameter of the distal common bile duct of 9 mm, stable. No biliary duct mass or calculus is evident. The common bile duct does taper distally. Pancreas: No pancreatic mass or inflammatory focus. Spleen: No splenic lesions are evident.  Adrenals/Urinary Tract: Adrenals appear normal. Kidneys bilaterally show no evidence of hydronephrosis. There is a cyst in the mid right kidney posteriorly measuring 1.6 x 1.1 cm. There is no renal or ureteral calculus on either side. Urinary bladder is midline with wall thickness within normal limits. Stomach/Bowel: Rectum is mildly distended with air. There is is mild thickening of the wall of the mid sigmoid colon without diverticular disease in this area. There is no surrounding mesenteric thickening at this site. The elsewhere there is no bowel wall or mesenteric thickening. No bowel obstruction. No free air or portal venous air is evident. Vascular/Lymphatic: There is slight atherosclerotic calcification in the aorta. There is no abdominal aortic aneurysm. No focal vascular lesions are apparent on this study. There is no adenopathy in the abdomen or pelvis. Reproductive: Uterus, ovaries, and appendix are absent. There is mild free fluid in the pelvis which appears stable. There is no pelvic mass or abscess. Other: No abscess or ascites is seen in the abdomen or pelvis. Musculoskeletal: There is scarring in the periumbilical region. No abscess or fistula is seen in this area. There are no blastic or lytic bone lesions. There is thoracolumbar levoscoliosis. IMPRESSION: Scarring and thickening in the periumbilical region without abscess or fistula. Evidence suggesting postoperative change in the pelvis, not progressed from recent study. Question mild colitis in the mid sigmoid colon, possibly of postoperative etiology given recent pelvic manipulation. No bowel obstruction. No contrast extravasation or free air seen in the abdomen or pelvis. Stable biliary duct dilatation with tapering of the common bile duct distally, stable. No mass or calculus seen in the biliary ductal system. Prominent liver without focal liver lesion apparent. Electronically Signed   By: WLowella GripIII M.D.   On: 11/12/2015 15:42    I have personally reviewed and evaluated these images and lab results as part of my medical decision-making.   EKG Interpretation None      MDM   Final diagnoses:  Abdominal pain  Post-op pain   50year old female presenting with drainage and pain from a surgical site[facility] or [facility]. Returns today because no improvement in symptoms. Afebrile and hemodynamically stable. Patient appears anxious but otherwise nontoxic. Tenderness at umbilicus with small amount of purulent drainage noted. No streaking, fluctuance or induration. No superficial abscess. Reviewed notes from previous visit to [facility] and there does not appear to be a change in her  surgical wound. No leukocytosis. CMP unremarkable. CT abdomen shows no abscess and has stable postoperative changes. Radiologist questions mild colitis. Patient is already taking Augmentin which would cover potential colitis. Patient does not have symptoms of colitis. Encouraged patient to continue her Augmentin and Percocet for pain. Patient is to follow-up with her surgeon as soon as possible. I have also discussed reasons to return immediately to the emergency department. Patient is stable for discharge.    Josephina Gip, PA-C 11/12/15 [place], DO 11/12/15 1740

## 2015-11-12 NOTE — ED Provider Notes (Signed)
I was called to the room to let the patient know what her CT images were then answer some further questions.  The patient is concerned that her pain is worsening despite being treated with Augmentin for a umbilical infection. She continues to have drainage, pus and blood from the wound. She recently had laparoscopy with oophorectomy. She denies fever, chills, nausea, vomiting, diarrhea, weakness or dizziness. She states that the pain is so bad that she "rolls around". She is frustrated that she has persistent symptoms, and is not improving.  Physical exam, limited- abdomen is soft. She has moderate periumbilical tenderness extending to the bilateral lower quadrants. There is no rebound tenderness. There is a small area of induration with an open wound at the superior aspect of the umbilicus. There is a very minor amount of pus within this open area. There is no abdominal wall cellulitis.  I discussed the case with the patient, and her husband, in detail. Discussed expected course, recent history, and additional possibilities as source for her pain. Her pain is clearly out of proportion to the findings clinically or on CT imaging. Therefore, I do not believe that she has severe wound infection, or other postsurgical complication. The area of colitis seen on the imaging, is very nonspecific, and is not associated with clinical findings that would raise concern for acute colitis. She does have history of Crohn's disease. She was seen last week by her GI doctor, who is the one that started her on Augmentin for the periumbilical infection. The patient was unable to see her GYN doctor today because she is "out of town".   After an extensive discussion, the patient and her husband agree to go home with additional treatment of Percocet for pain, and a trial of aggressive treatment for the abdominal discomfort and wound. The patient plans of following up with GI, and gynecology, by phone, and perhaps with visits, for  further direction of her care of this ongoing painful condition. Patient was given extensive verbal instructions, by me, as well as printed instruction, and is comfortable going home at this time  Daleen Bo, MD 11/12/15 [phone]

## 2015-11-12 NOTE — Discharge Instructions (Signed)
Use heat on the sore area 3 or 4 times a day. Gait is sitting in a tub, use warm compresses or heating pads. Call your GI doctor for follow-up appointment, to discuss the pain, and the mild colitis that was seen on the CT imaging. Call your gynecologist, to report the progress on the draining area of the umbilicus. She may want to see you again for evaluation. Return here if needed, for problems.    Abdominal Pain, Adult Many things can cause abdominal pain. Usually, abdominal pain is not caused by a disease and will improve without treatment. It can often be observed and treated at home. Your health care provider will do a physical exam and possibly order blood tests and X-rays to help determine the seriousness of your pain. However, in many cases, more time must pass before a clear cause of the pain can be found. Before that point, your health care provider may not know if you need more testing or further treatment. HOME CARE INSTRUCTIONS Monitor your abdominal pain for any changes. The following actions may help to alleviate any discomfort you are experiencing:  Only take over-the-counter or prescription medicines as directed by your health care provider.  Do not take laxatives unless directed to do so by your health care provider.  Try a clear liquid diet (broth, tea, or water) as directed by your health care provider. Slowly move to a bland diet as tolerated. SEEK MEDICAL CARE IF:  You have unexplained abdominal pain.  You have abdominal pain associated with nausea or diarrhea.  You have pain when you urinate or have a bowel movement.  You experience abdominal pain that wakes you in the night.  You have abdominal pain that is worsened or improved by eating food.  You have abdominal pain that is worsened with eating fatty foods.  You have a fever. SEEK IMMEDIATE MEDICAL CARE IF:  Your pain does not go away within 2 hours.  You keep throwing up (vomiting).  Your pain is felt  only in portions of the abdomen, such as the right side or the left lower portion of the abdomen.  You pass bloody or black tarry stools. MAKE SURE YOU:  Understand these instructions.  Will watch your condition.  Will get help right away if you are not doing well or get worse.   This information is not intended to replace advice given to you by your health care provider. Make sure you discuss any questions you have with your health care provider.   Document Released: 04/23/2005 Document Revised: 04/04/2015 Document Reviewed: 03/23/2013 Elsevier Interactive Patient Education [employer].

## 2015-11-12 NOTE — ED Notes (Signed)
PT VOICING CONCERNS ABOUT HAVING CT SCAN PERFORMED, BECAUSE SHE HAD IT DONE ON Friday. PA STEVI MADE AWARE.

## 2016-05-11 IMAGING — CT CT ABD-PELV W/ CM
2 of 5 series · 15 of 46 positions shown, 17 images · IV contrast (ISOVUE)
Comparison: November 09, 2015

CLINICAL DATA: Recent salpingooophorectomy and lysis of adhesions.
Hysterectomy 1 month prior. Pus from surgical site. Pain.

EXAM:
CT ABDOMEN AND PELVIS WITH CONTRAST
TECHNIQUE: Multidetector CT imaging of the abdomen and pelvis was performed
using the standard protocol following bolus administration of
intravenous contrast. Oral contrast was also administered.
CONTRAST:  100mL 6GZBEL-8KK IOPAMIDOL (6GZBEL-8KK) INJECTION 61%

[Series 2: abd/pel with · axial · 0.74mm/px · z∈[-514,-99]mm · 12 of 95 slices shown, 14 images]
[im 6/95  soft-tissue]
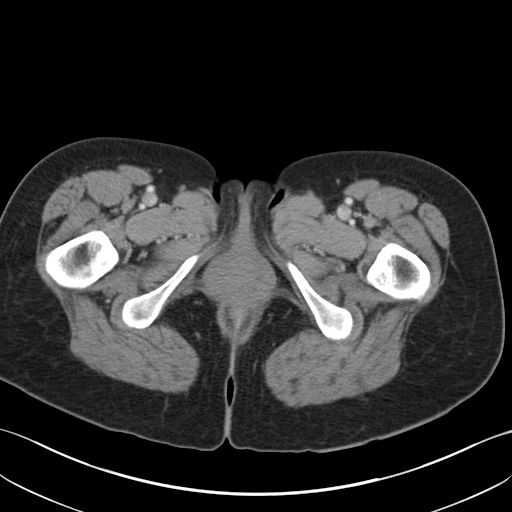
[im 6/95  bone]
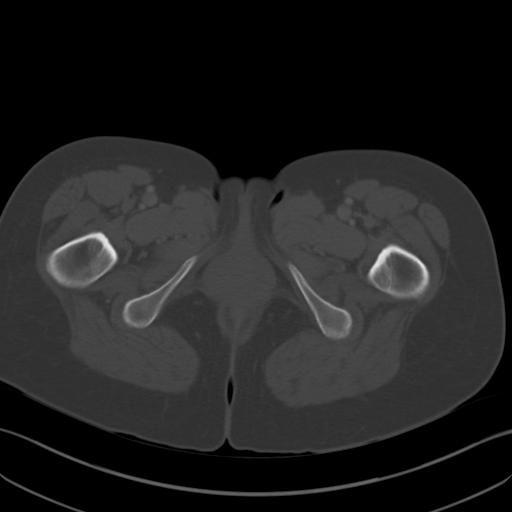
[im 12/95  soft-tissue]
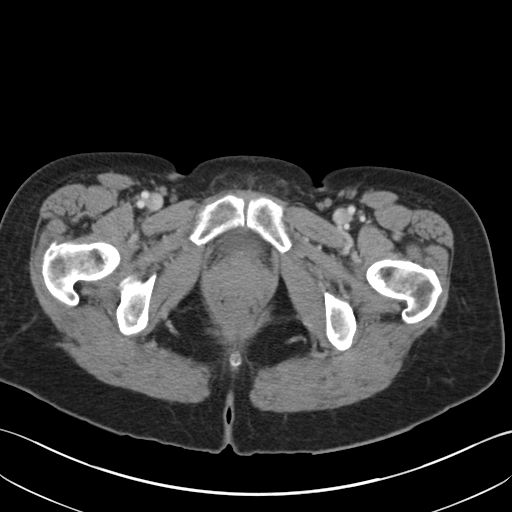
[im 24/95  soft-tissue]
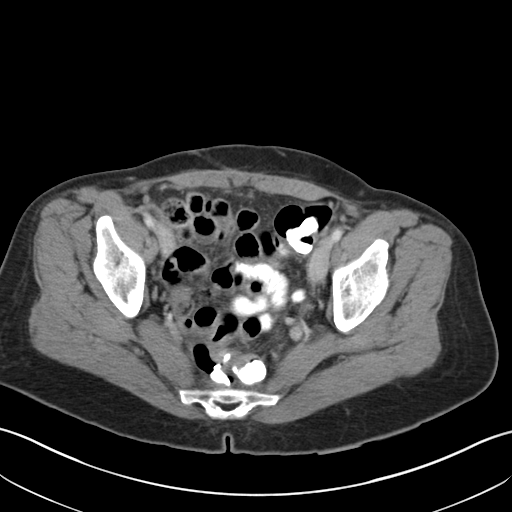
[im 30/95  soft-tissue]
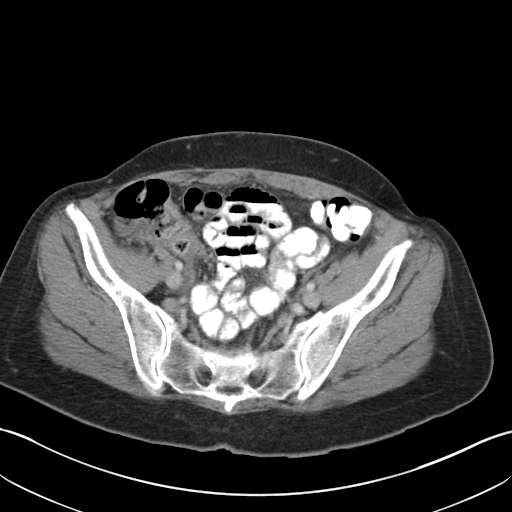
[im 36/95  soft-tissue]
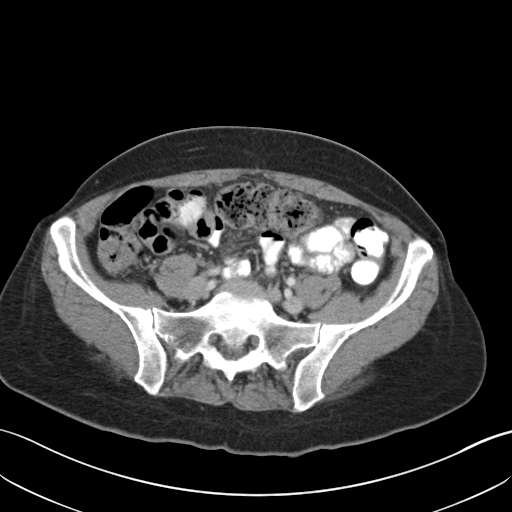
[im 42/95  soft-tissue]
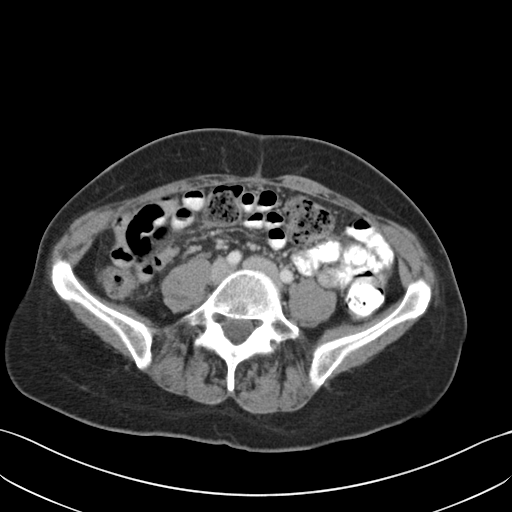
[im 53/95  soft-tissue]
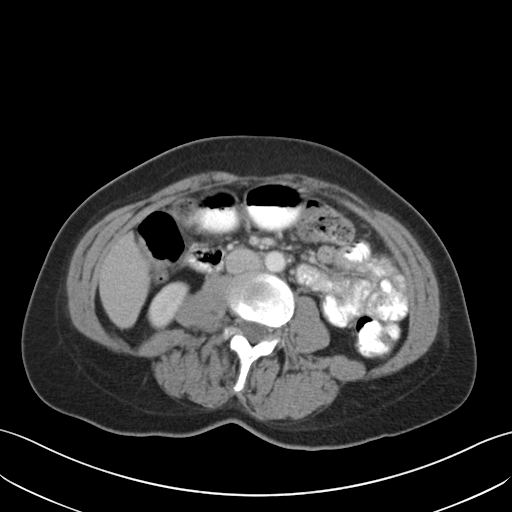
[im 59/95  soft-tissue]
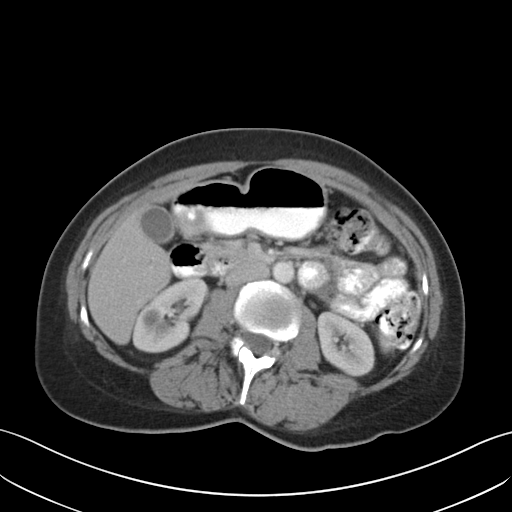
[im 65/95  soft-tissue]
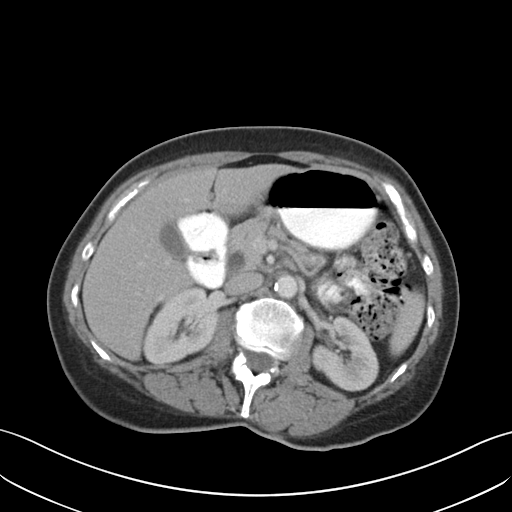
[im 65/95  bone]
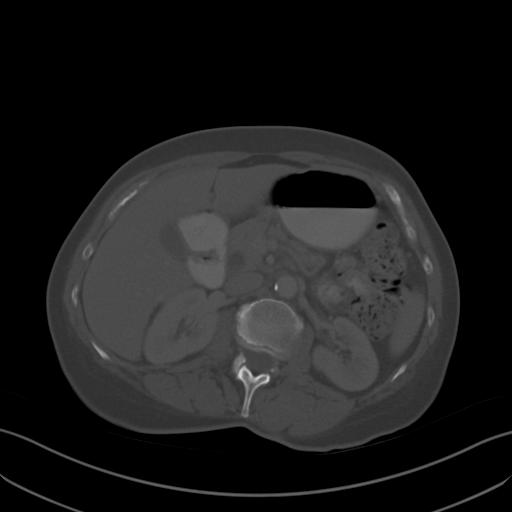
[im 71/95  soft-tissue]
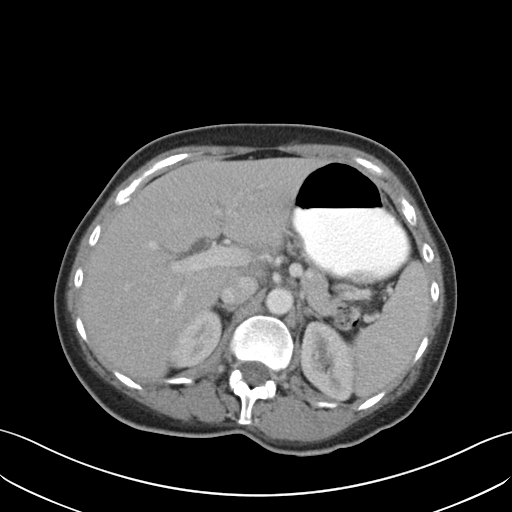
[im 83/95  soft-tissue]
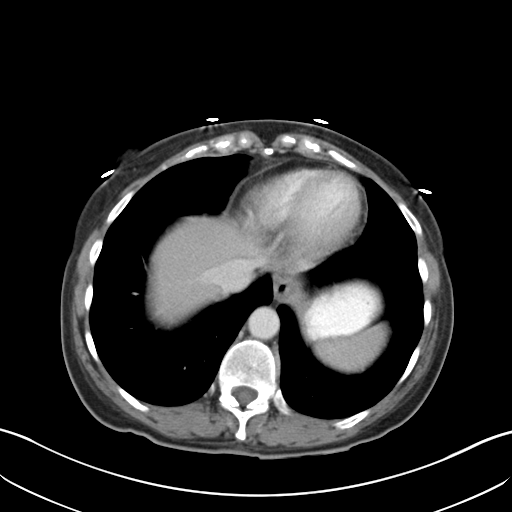
[im 89/95  soft-tissue]
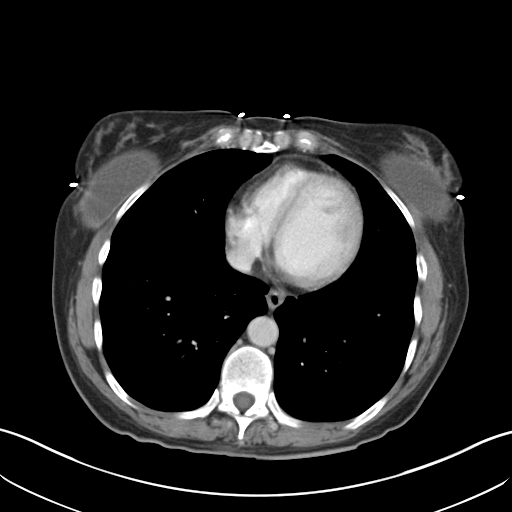

[Series 5: coronal a/|p · coronal · 0.74mm/px · 3 of 132 slices shown]
[im 44/132  soft-tissue]
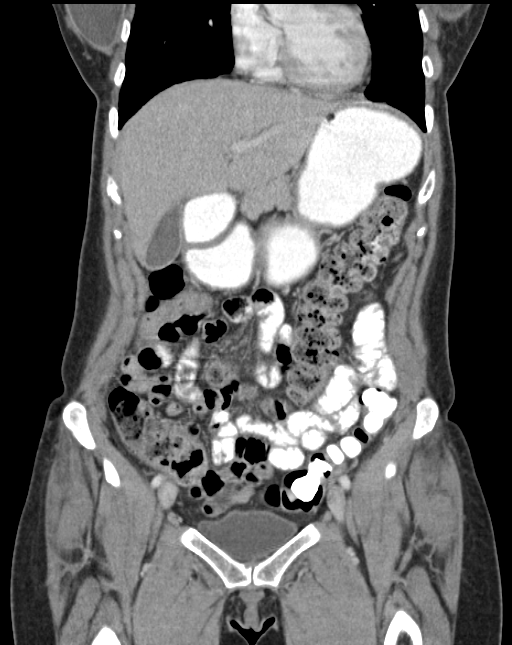
[im 59/132  soft-tissue]
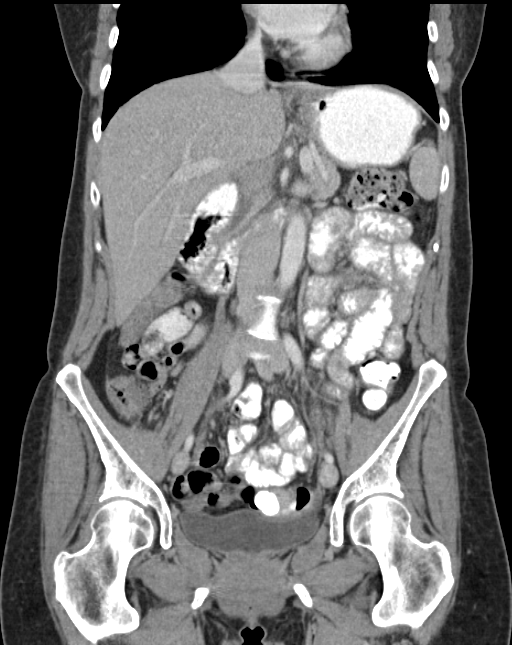
[im 73/132  soft-tissue]
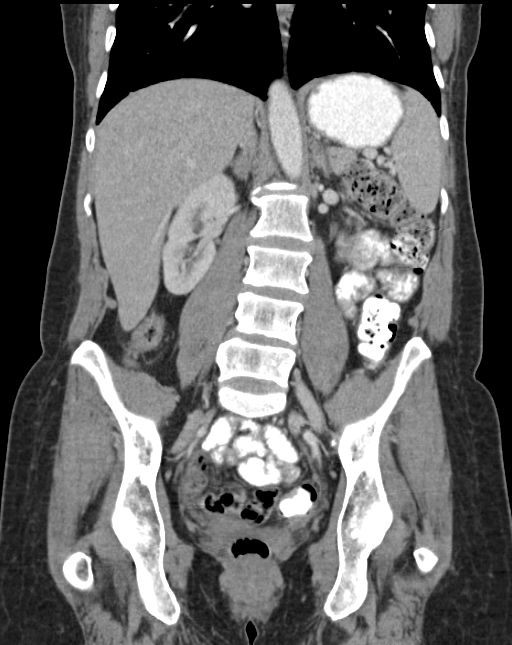

[15 of 46 positions shown; findings below may reference images not displayed]

FINDINGS: Lower chest: Lung bases are clear. Breast implants noted
bilaterally.

Hepatobiliary: Liver is prominent, measuring 19.6 cm in length. No
focal liver lesions are identified. The gallbladder wall is not
thickened. There is common bile duct prominence with a measured
diameter of the distal common bile duct of 9 mm, stable. No biliary
duct mass or calculus is evident. The common bile duct does taper
distally.

Pancreas: No pancreatic mass or inflammatory focus.

Spleen: No splenic lesions are evident.

Adrenals/Urinary Tract: Adrenals appear normal. Kidneys bilaterally
show no evidence of hydronephrosis. There is a cyst in the mid right
kidney posteriorly measuring 1.6 x 1.1 cm. There is no renal or
ureteral calculus on either side. Urinary bladder is midline with
wall thickness within normal limits.

Stomach/Bowel: Rectum is mildly distended with air. There is is mild
thickening of the wall of the mid sigmoid colon without diverticular
disease in this area. There is no surrounding mesenteric thickening
at this site. The elsewhere there is no bowel wall or mesenteric
thickening. No bowel obstruction. No free air or portal venous air
is evident.

Vascular/Lymphatic: There is slight atherosclerotic calcification in
the aorta. There is no abdominal aortic aneurysm. No focal vascular
lesions are apparent on this study. There is no adenopathy in the
abdomen or pelvis.

Reproductive: Uterus, ovaries, and appendix are absent. There is
mild free fluid in the pelvis which appears stable. There is no
pelvic mass or abscess.

Other: No abscess or ascites is seen in the abdomen or pelvis.

Musculoskeletal: There is scarring in the periumbilical region. No
abscess or fistula is seen in this area. There are no blastic or
lytic bone lesions. There is thoracolumbar levoscoliosis.
IMPRESSION: Scarring and thickening in the periumbilical region without abscess
or fistula. Evidence suggesting postoperative change in the pelvis,
not progressed from recent study. Question mild colitis in the mid
sigmoid colon, possibly of postoperative etiology given recent
pelvic manipulation. No bowel obstruction. No contrast extravasation
or free air seen in the abdomen or pelvis.

Stable biliary duct dilatation with tapering of the common bile duct
distally, stable. No mass or calculus seen in the biliary ductal
system. Prominent liver without focal liver lesion apparent.

## 2016-09-03 ENCOUNTER — Ambulatory Visit: Payer: BLUE CROSS/BLUE SHIELD | Admitting: Physical Medicine & Rehabilitation

## 2016-09-12 ENCOUNTER — Encounter: Payer: BLUE CROSS/BLUE SHIELD | Admitting: Physical Medicine & Rehabilitation

## 2016-09-17 ENCOUNTER — Encounter
Payer: Worker's Compensation | Attending: Physical Medicine & Rehabilitation | Admitting: Physical Medicine & Rehabilitation

## 2016-09-17 ENCOUNTER — Encounter: Payer: Self-pay | Admitting: Physical Medicine & Rehabilitation

## 2016-09-17 VITALS — BP 120/83 | HR 81 | Resp 14

## 2016-09-17 DIAGNOSIS — G629 Polyneuropathy, unspecified: Secondary | ICD-10-CM | POA: Insufficient documentation

## 2016-09-17 DIAGNOSIS — W19XXXA Unspecified fall, initial encounter: Secondary | ICD-10-CM

## 2016-09-17 DIAGNOSIS — G8929 Other chronic pain: Secondary | ICD-10-CM | POA: Diagnosis not present

## 2016-09-17 DIAGNOSIS — Z79899 Other long term (current) drug therapy: Secondary | ICD-10-CM

## 2016-09-17 DIAGNOSIS — Z5181 Encounter for therapeutic drug level monitoring: Secondary | ICD-10-CM | POA: Diagnosis not present

## 2016-09-17 DIAGNOSIS — M5136 Other intervertebral disc degeneration, lumbar region: Secondary | ICD-10-CM | POA: Insufficient documentation

## 2016-09-17 DIAGNOSIS — M25511 Pain in right shoulder: Secondary | ICD-10-CM | POA: Insufficient documentation

## 2016-09-17 DIAGNOSIS — Z87891 Personal history of nicotine dependence: Secondary | ICD-10-CM | POA: Insufficient documentation

## 2016-09-17 DIAGNOSIS — K5090 Crohn's disease, unspecified, without complications: Secondary | ICD-10-CM | POA: Diagnosis not present

## 2016-09-17 DIAGNOSIS — M792 Neuralgia and neuritis, unspecified: Secondary | ICD-10-CM | POA: Diagnosis not present

## 2016-09-17 DIAGNOSIS — G479 Sleep disorder, unspecified: Secondary | ICD-10-CM | POA: Insufficient documentation

## 2016-09-17 MED ORDER — LIDOCAINE 5 % EX PTCH: 1.0000 | MEDICATED_PATCH | CUTANEOUS | 1 refills | Status: DC

## 2016-09-17 MED ORDER — METHOCARBAMOL 500 MG PO TABS: 1000.0000 mg | ORAL_TABLET | Freq: Three times a day (TID) | ORAL | 1 refills | Status: DC | PRN

## 2016-09-17 MED ORDER — GABAPENTIN 300 MG PO CAPS: 300.0000 mg | ORAL_CAPSULE | Freq: Three times a day (TID) | ORAL | 1 refills | Status: DC

## 2016-09-17 MED ORDER — AMITRIPTYLINE HCL 50 MG PO TABS: 50.0000 mg | ORAL_TABLET | Freq: Every day | ORAL | 1 refills | Status: DC

## 2016-09-17 NOTE — Progress Notes (Signed)
Subjective:    Patient ID: [name], female    DOB: 1965/09/21, 51 y.o.   MRN: [id number]  HPI 51 y/o female with pmh/psh of chron's, lower DDD, chronic back pain, right subacromial decompression, right rotator cuff repeair presents with pain in right shoulder.  Started 6/11/217 after fall at work.  Recently pt slipped and hyperextended her arm after the surgery.  She called Ortho and plan is for repeat MRI in 8 weeks.  Ice improves the pain. Laying down exacerbates the pain.  Sharp, throbbing.  Occasional radiation to 4th and 5th digits.  She is currently in PT.  Associated weakness in arm.  Pain limits ADLs.  She was prescribed Oxy by Ortho and Dextroamphetamine as a trial by Psychiatry.   Pain Inventory Average Pain 6 Pain Right Now 6 My pain is constant and sharp  In the last 24 hours, has pain interfered with the following? General activity 10 Relation with others 10 Enjoyment of life 10 What TIME of day is your pain at its worst? evening, night Sleep (in general) Poor  Pain is worse with: walking, bending, sitting, inactivity, standing and some activites Pain improves with: heat/ice and medication Relief from Meds: 9  Mobility walk without assistance how many minutes can you walk? 30 ability to climb steps?  yes do you drive?  yes Do you have any goals in this area?  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  yes  Neuro/Psych anxiety  Prior Studies new visit  Physicians involved in your care new visit   Family History  Problem Relation Age of Onset  . Alzheimer's disease Mother    Social History   Social History  . Marital status: Divorced    Spouse name: N/A  . Number of children: N/A  . Years of education: N/A   Social History Main Topics  . Smoking status: Former Smoker   Packs/day: 2.00    Types: Cigarettes    Quit date: 08/01/2008  . Smokeless tobacco: Never Used     Comment: 1 cig./day  . Alcohol use Yes     Comment: occasionally  . Drug use: No  . Sexual activity: Yes    Birth control/ protection: Surgical   Other Topics Concern  . None   Social History Narrative  . None   Past Surgical History:  Procedure Laterality Date  . ABDOMINAL HYSTERECTOMY  10/23/2005   partial  . APPENDECTOMY    . BREAST ENHANCEMENT SURGERY  1998  . BUNIONECTOMY    . CLOSED REDUCTION NASAL FRACTURE N/A 05/14/2015   Procedure: CLOSED REDUCTION NASAL FRACTURE;  Surgeon: Leta Baptist, MD;  Location: [facility];  Service: ENT;  Laterality: N/A;  . FINGER SURGERY Right    middle finger - x 2 others besides ORIF  . KNEE ARTHROSCOPY    . ORIF FINGER FRACTURE Right    middle finger  . ROBOTIC ASSISTED BILATERAL SALPINGO OOPHERECTOMY Bilateral 10/17/2015   Procedure: ROBOTIC ASSISTED BILATERAL SALPINGO OOPHORECTOMY;  Surgeon: Bobbye Charleston, MD;  Location: [facility];  Service: Gynecology;  Laterality: Bilateral;  . ROBOTIC ASSISTED LAPAROSCOPIC LYSIS OF ADHESION N/A 10/17/2015   Procedure: ROBOTIC ASSISTED LAPAROSCOPIC LYSIS OF ADHESION, CAUTERY OF ENDOMETRIOSIS;  Surgeon: Bobbye Charleston, MD;  Location: [facility];  Service: Gynecology;  Laterality: N/A;  . STAPEDECTOMY Left   .  TONSILLECTOMY     age 22   Past Medical History:  Diagnosis Date  . Bulging lumbar disc   . Chronic lower back pain   . Crohn's disease ([place])   . DDD (degenerative disc disease), lumbar   . Dental crowns present   . Hearing loss    left  . Nasal bone fracture 04/2015  . Seasonal allergies   . Tension headache   . TMJ syndrome   . Wears hearing aid    left ear   BP 120/83   Pulse 81   Resp 14   SpO2 97%   Opioid Risk Score:   Fall Risk Score:  `1  Depression screen PHQ 2/9  Depression screen PHQ 2/9 09/17/2016  Decreased Interest 2  Down, Depressed, Hopeless 2  PHQ - 2  Score 4  Altered sleeping 3  Tired, decreased energy 1  Change in appetite 0  Feeling bad or failure about yourself  0  Trouble concentrating 2  Moving slowly or fidgety/restless 0  Suicidal thoughts 0  PHQ-9 Score 10  Difficult doing work/chores Extremely dIfficult     Review of Systems  Constitutional: Negative.   HENT: Negative.   Eyes: Negative.   Respiratory: Negative.   Cardiovascular: Negative.   Gastrointestinal: Positive for constipation and nausea.  Endocrine: Negative.   Genitourinary: Negative.   Musculoskeletal: Positive for arthralgias and back pain.  Skin: Negative.   Allergic/Immunologic: Negative.   Neurological: Positive for dizziness.  Hematological: Negative.   Psychiatric/Behavioral: The patient is nervous/anxious.   All other systems reviewed and are negative.      Objective:   Physical Exam Gen: NAD. Vital signs reviewed HENT: Normocephalic, Atraumatic Eyes: EOMI. No discharge.  Cardio: RRR. No JVD. Pulm: B/l clear to auscultation.  Effort normal Abd:  Soft, BS+ MSK:  Gait WNL.   TTP right shoulder.    No edema.   Limited ROM due to recent surgery  Neg Tinnels at right ulnar elbow and wrist Neuro:   Sensation intact to light touch in all UE dermatomes  Reflexes 2+ throughout  Strength  5/5 in all UE myotomes, except right shoulder - limited due to pain and recent surgery Skin: Warm and Dry. Intact  Psych: Normal behavior and mood. Assessment & Plan:  51 y/o female with pmh/psh of chron's, lower DDD, chronic back pain, right subacromial decompression, right rotator cuff repeair presents with pain in right shoulder.    1. Chronic right shoulder pain with Neuropathic pain  MRI 07/2016 from Ortho prior to surgery, requested and obtained suggesting rotator cuff tendinosis and bursitis  Labs reviewed  Referral information reviewed  NCCSRS reviewed  UDS performed   Unable to tolerate Mobic due to Chron's.  Pt states tramadol and Robaxin due to  not work (unsure about dose)  Cont Cold  Cont PT  Cont sling  Pt has TENS at home, encouraged trail  Will order Lidoderm patch  Gabapentin 300 TID  Will order Robaxin 1000 TID PRN  Will consider Cymbalta in future  Cont to follow Psychiatry/counselor   Will wean narcotics (currently taking 12m TID), pt in agreement   2. Falls  States falls are from poor footwear and not related to gait abnormality  3. Sleep disturbance  Will trail Elavil 5110m 25 ineffective

## 2016-09-17 NOTE — Addendum Note (Signed)
Addended by: Geryl Rankins D on: 09/17/2016 03:48 PM   Modules accepted: Orders

## 2016-09-22 ENCOUNTER — Telehealth: Payer: Self-pay

## 2016-09-22 NOTE — Telephone Encounter (Signed)
Lidoderm patches denied by insurance company:  Your Lidoderm criteria covers this drug when you have one of the following: - Pain associated with post-herpetic neuralgia - Pain associated with diabetic neuropathy - Pain associated with cancer-related neuropathy (including treatment-related neuropathy) Your use of this drug does not meet the requirement. This is based on the information we have.  Please advise.

## 2016-09-22 NOTE — Telephone Encounter (Signed)
She may try the OTC lidoderm patches.  Thanks.

## 2016-09-23 LAB — TOXASSURE SELECT,+ANTIDEPR,UR

## 2016-09-24 NOTE — Telephone Encounter (Signed)
Patient has been notified to buy the OTC Lidoderm patches.

## 2016-09-24 NOTE — Progress Notes (Signed)
Urine drug screen for this encounter is consistent for prescribed medication. There are trace amounts of hydrocodone and metabolite hydromorphone.  Per Toxicology report : Interpret results with caution. Trace amounts of hydrocodone in   the presence of large amounts of oxycodone may be due to impurities present in the dosage form, So being marked as consistent

## 2016-10-15 ENCOUNTER — Encounter: Payer: Self-pay | Admitting: Physical Medicine & Rehabilitation

## 2016-10-15 ENCOUNTER — Encounter
Payer: Worker's Compensation | Attending: Physical Medicine & Rehabilitation | Admitting: Physical Medicine & Rehabilitation

## 2016-10-15 VITALS — BP 120/74 | HR 84

## 2016-10-15 DIAGNOSIS — K5090 Crohn's disease, unspecified, without complications: Secondary | ICD-10-CM | POA: Diagnosis not present

## 2016-10-15 DIAGNOSIS — G629 Polyneuropathy, unspecified: Secondary | ICD-10-CM | POA: Insufficient documentation

## 2016-10-15 DIAGNOSIS — M5136 Other intervertebral disc degeneration, lumbar region: Secondary | ICD-10-CM | POA: Diagnosis not present

## 2016-10-15 DIAGNOSIS — M25511 Pain in right shoulder: Secondary | ICD-10-CM | POA: Insufficient documentation

## 2016-10-15 DIAGNOSIS — G479 Sleep disorder, unspecified: Secondary | ICD-10-CM | POA: Insufficient documentation

## 2016-10-15 DIAGNOSIS — Z87891 Personal history of nicotine dependence: Secondary | ICD-10-CM | POA: Insufficient documentation

## 2016-10-15 DIAGNOSIS — M792 Neuralgia and neuritis, unspecified: Secondary | ICD-10-CM

## 2016-10-15 DIAGNOSIS — G8929 Other chronic pain: Secondary | ICD-10-CM | POA: Insufficient documentation

## 2016-10-15 MED ORDER — OXYCODONE HCL 5 MG PO TABS: 5.0000 mg | ORAL_TABLET | ORAL | 0 refills | Status: DC | PRN

## 2016-10-15 MED ORDER — GABAPENTIN 300 MG PO CAPS: 600.0000 mg | ORAL_CAPSULE | Freq: Three times a day (TID) | ORAL | 0 refills | Status: DC

## 2016-10-15 NOTE — Progress Notes (Addendum)
Subjective:    Patient ID: [name], female    DOB: 26-May-1966, 51 y.o.   MRN: [id number]  HPI 51 y/o female with pmh/psh of chron's, lower DDD, chronic back pain, right subacromial decompression, right rotator cuff repeair presents for follow up of pain in right shoulder.   Initially stated: Started 6/11/217 after fall at work.  Recently pt slipped and hyperextended her arm after the surgery.  She called Ortho and plan is for repeat MRI in 8 weeks.  Ice improves the pain. Laying down exacerbates the pain.  Sharp, throbbing.  Occasional radiation to 4th and 5th digits.  She is currently in PT.  Associated weakness in arm.  Pain limits ADLs.  She was prescribed Oxy by Ortho and Dextroamphetamine as a trial by Psychiatry.   Last clinic visit: 09/17/16. At that time, she continues to use cold, which has helped her shoulder.  She continues to be in therapy.  Today is her last day of using the sling.  She is using TENS with benefit. She tried the Lidoderm patch, which is helping.  Gabapentin is helping.  Robaxin is helping. She states benefit with Elavil. She is still taking Percocet.   Pain Inventory Average Pain 9 Pain Right Now 4 My pain is constant and sharp  In the last 24 hours, has pain interfered with the following? General activity 5 Relation with others 5 Enjoyment of life 5 What TIME of day is your pain at its worst? evening, night Sleep (in general) Poor  Pain is worse with: sitting, inactivity and some activites Pain improves with: therapy/exercise, medication and TENS Relief from Meds: 10  Mobility walk without assistance how many minutes can you walk? 60 ability to climb steps?  yes do you drive?  yes Do you have any goals in this area?  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  no new visit  Physicians involved in your care Any changes since last  visit?  no   Family History  Problem Relation Age of Onset  . Alzheimer's disease Mother    Social History   Social History  . Marital status: Divorced    Spouse name: N/A  . Number of children: N/A  . Years of education: N/A   Social History Main Topics  . Smoking status: Former Smoker    Packs/day: 2.00    Types: Cigarettes    Quit date: 08/01/2008  . Smokeless tobacco: Never Used     Comment: 1 cig./day  . Alcohol use Yes     Comment: occasionally  . Drug use: No  . Sexual activity: Yes    Birth control/ protection: Surgical   Other Topics Concern  . None   Social History Narrative  . None   Past Surgical History:  Procedure Laterality Date  . ABDOMINAL HYSTERECTOMY  10/23/2005   partial  . APPENDECTOMY    . BREAST ENHANCEMENT SURGERY  1998  . BUNIONECTOMY    . CLOSED REDUCTION NASAL FRACTURE N/A 05/14/2015   Procedure: CLOSED REDUCTION NASAL FRACTURE;  Surgeon: Leta Baptist, MD;  Location: [facility];  Service: ENT;  Laterality: N/A;  . FINGER SURGERY Right    middle finger - x 2 others besides ORIF  . KNEE ARTHROSCOPY    . ORIF FINGER FRACTURE Right    middle finger  . ROBOTIC ASSISTED BILATERAL SALPINGO OOPHERECTOMY Bilateral 10/17/2015  Procedure: ROBOTIC ASSISTED BILATERAL SALPINGO OOPHORECTOMY;  Surgeon: Bobbye Charleston, MD;  Location: [facility];  Service: Gynecology;  Laterality: Bilateral;  . ROBOTIC ASSISTED LAPAROSCOPIC LYSIS OF ADHESION N/A 10/17/2015   Procedure: ROBOTIC ASSISTED LAPAROSCOPIC LYSIS OF ADHESION, CAUTERY OF ENDOMETRIOSIS;  Surgeon: Bobbye Charleston, MD;  Location: [facility];  Service: Gynecology;  Laterality: N/A;  . STAPEDECTOMY Left   . TONSILLECTOMY     age 84   Past Medical History:  Diagnosis Date  . Bulging lumbar disc   . Chronic lower back pain   . Crohn's disease ([place])   . DDD (degenerative disc disease), lumbar   . Dental crowns present   . Hearing loss    left  . Nasal bone fracture 04/2015  . Seasonal  allergies   . Tension headache   . TMJ syndrome   . Wears hearing aid    left ear   BP 120/74   Pulse 84   SpO2 97%   Opioid Risk Score:   Fall Risk Score:  `1  Depression screen PHQ 2/9  Depression screen [facility] 2/9 10/15/2016 09/17/2016  Decreased Interest 2 2  Down, Depressed, Hopeless 2 2  PHQ - 2 Score 4 4  Altered sleeping - 3  Tired, decreased energy - 1  Change in appetite - 0  Feeling bad or failure about yourself  - 0  Trouble concentrating - 2  Moving slowly or fidgety/restless - 0  Suicidal thoughts - 0  PHQ-9 Score - 10  Difficult doing work/chores - Extremely dIfficult     Review of Systems  Constitutional: Positive for unexpected weight change.  HENT: Negative.   Eyes: Negative.   Respiratory: Negative.   Cardiovascular: Negative.   Gastrointestinal: Negative.   Endocrine: Negative.   Genitourinary: Negative.   Musculoskeletal: Negative.   Skin: Negative.   Allergic/Immunologic: Negative.   Neurological: Positive for numbness.       Tingling  Hematological: Negative.   Psychiatric/Behavioral: Negative.   All other systems reviewed and are negative.     Objective:   Physical Exam Gen: NAD. Vital signs reviewed HENT: Normocephalic, Atraumatic Eyes: EOMI. No discharge.  Cardio: RRR. No JVD. Pulm: B/l clear to auscultation.  Effort normal Abd:  Soft, BS+ MSK:  Gait WNL.   +TTP right shoulder.    No edema.   Limited ROM due to recent surgery/sling Neuro:   Sensation intact to light touch in all UE dermatomes  Strength  5/5 in all UE myotomes, except right shoulder - limited due to pain and recent surgery Skin: Warm and Dry. Intact  Psych: Normal behavior and mood.  Assessment & Plan:  51 y/o female with pmh/psh of chron's, lower DDD, chronic back pain, right subacromial decompression, right rotator cuff repeair presents with pain in right shoulder.    1. Chronic right shoulder pain with Neuropathic pain  MRI 07/2016 from Ortho prior to  surgery, requested and obtained suggesting rotator cuff tendinosis and bursitis  NCCSRS reviewed  UDS reviewed   Unable to tolerate Mobic due to Chron's.  Pt states tramadol and Robaxin due to not work (unsure about dose)  Cont Cold  Cont PT  Cont sling  Cont TENS at home, encouraged trail  Cont Lidoderm patch OTC  Will increase Gabapentin 600 TID  Cont Robaxin 1000 TID PRN  Will consider Cymbalta in future  Cont to follow Psychiatry/counselor   Will wean narcotics (currently taking 13m TID), pt in agreement, reminded again, non-narcotic management. Last refill ordered today.  2. Sleep disturbance  Cont Elavil 9m on non early morning work days

## 2016-10-15 NOTE — Addendum Note (Signed)
Addended by: Delice Lesch A on: 10/15/2016 12:18 PM   Modules accepted: Orders

## 2016-10-27 ENCOUNTER — Telehealth: Payer: Self-pay | Admitting: Physical Medicine & Rehabilitation

## 2016-10-27 NOTE — Telephone Encounter (Signed)
DR PATEL PATIENT CALLED 03.29.18 AND STATES YOU HAVE WEANED HER DOWN FROM MEDS AND SHE WOULD LIKE FOR YOU TO ORDER MRI OF LOWER BACK OR SOMETHING TO DETERMINE WHY SHE HAS PAIN NOW THAT THE MEDS ARE LOWER - SHE STILL HURTS PLEASE RETURN CALL TO [phone]

## 2016-10-27 NOTE — Telephone Encounter (Signed)
She was referred to me and we have only addressed her right shoulder pain (which is why her medications were prescribed to her).  We can discuss her back on her next visit.

## 2016-11-24 ENCOUNTER — Telehealth: Payer: Self-pay

## 2016-11-24 NOTE — Telephone Encounter (Signed)
Discharge letter sent through [facility]

## 2016-11-24 NOTE — Telephone Encounter (Signed)
Patient states she would like to be d/c from our office. If there are any questions or concerns please give patient a call at [phone]

## 2016-11-26 ENCOUNTER — Encounter: Payer: BLUE CROSS/BLUE SHIELD | Admitting: Physical Medicine & Rehabilitation

## 2018-08-06 ENCOUNTER — Ambulatory Visit: Payer: Self-pay | Admitting: Physician Assistant

## 2018-08-06 ENCOUNTER — Encounter: Payer: Self-pay | Admitting: Physician Assistant

## 2018-08-06 DIAGNOSIS — K5010 Crohn's disease of large intestine without complications: Secondary | ICD-10-CM | POA: Insufficient documentation

## 2018-08-06 DIAGNOSIS — G4700 Insomnia, unspecified: Secondary | ICD-10-CM

## 2018-08-06 DIAGNOSIS — F411 Generalized anxiety disorder: Secondary | ICD-10-CM

## 2018-08-06 DIAGNOSIS — F900 Attention-deficit hyperactivity disorder, predominantly inattentive type: Secondary | ICD-10-CM

## 2018-08-06 MED ORDER — AMPHETAMINE-DEXTROAMPHET ER 15 MG PO CP24: 15.0000 mg | ORAL_CAPSULE | Freq: Two times a day (BID) | ORAL | 0 refills | Status: DC | PRN

## 2018-08-06 NOTE — Progress Notes (Signed)
[facility]  Patient ID: [name],  MRN: [id number]  PCP: Patient, No Pcp Per  Date of Evaluation: 08/06/2018 Time spent:15 minutes  Chief Complaint:  Chief Complaint    Stress      HISTORY/CURRENT STATUS: HPI Under a lot of stress.  Mom with alzheimers is getting worse. She got really upset and wanted to call the police b/c pt was trying to get her to take her meds.  Then she ran out in the road and flagged down 2 cars who called the police.  After the police were there for a few mins, they realized the situation and didn't do anything.  Will be placing her Mom in [facility], if they have a bed available. She just isn't safe anymore and patient is unable to leave her at home alone.  Pt had lost her job in Aug.  She's getting a new job in a few weeks. Will be working for [employer]. Will be in training for 10 weeks. Asks if I would consider re-starting the Adderall, at least during her training.  It worked well in the past.  We had stopped it after she lost her job because was not needing it as much but also that she needed the Xanax more.  She is aware that she cannot take both at the same time.  Feels sad, wants to sleep a lot but can't.  No she probably needs to do something about the depression but feels that it is more important to be able to focus well and do a good job at her new place of employment first.  The depressive symptoms are not stopping her from doing normal routine life stuff and she does have some times that she enjoys.  She went horseback riding with a friend recently, while her husband stayed home with her mother since she cannot be left alone.  It's been really difficult financially and she's applied for food stamps, but hasn't gotten them yet.  "We are in bad shape.  If we had to go much longer without income would be homeless."  Overall, anxiety is well controlled.  She has 9 Xanax left that was filled on December 1.  She sleeps fairly  well.  The biggest problem is that her mom will come wake her up in the middle of the night thinking it is time to get up and will get dressed and is unable to sleep because of that.  Individual Medical History/ Review of Systems: Changes? :No    Past medications for mental health diagnoses include: Paxil, Prozac, Lexapro, Celexa, Cymbalta, Abilify, Vistaril, Strattera, Xanax, Klonopin, Elavil, Adderall XR  Allergies: Sulfa antibiotics  Current Medications:  Current Outpatient Medications:  .  ALPRAZolam (XANAX) 0.5 MG tablet, Take 0.25-0.5 mg by mouth 2 (two) times daily as needed for anxiety., Disp: , Rfl:  .  estradiol (ESTRACE) 0.5 MG tablet, Take 0.5 mg by mouth daily., Disp: , Rfl:  .  traZODone (DESYREL) 100 MG tablet, Take 100-200 mg by mouth at bedtime as needed for sleep., Disp: , Rfl:  .  amphetamine-dextroamphetamine (ADDERALL XR) 15 MG 24 hr capsule, Take 1 capsule by mouth 2 (two) times daily as needed., Disp: 60 capsule, Rfl: 0 .  [START ON 09/05/2018] amphetamine-dextroamphetamine (ADDERALL XR) 15 MG 24 hr capsule, Take 1 capsule by mouth 2 (two) times daily as needed., Disp: 60 capsule, Rfl: 0 .  [START ON 10/03/2018] amphetamine-dextroamphetamine (ADDERALL XR) 15 MG 24 hr capsule, Take 1 capsule by  mouth 2 (two) times daily as needed., Disp: 60 capsule, Rfl: 0 Medication Side Effects: none  Family Medical/ Social History: Changes? Yes see above   MENTAL HEALTH EXAM:  There were no vitals taken for this visit.There is no height or weight on file to calculate BMI.  General Appearance: Casual and Well Groomed  Eye Contact:  Good  Speech:  Clear and Coherent  Volume:  Normal  Mood:  Euthymic  Affect:  Appropriate  Thought Process:  Goal Directed  Orientation:  Full (Time, Place, and Person)  Thought Content: Logical   Suicidal Thoughts:  No  Homicidal Thoughts:  No  Memory:  WNL  Judgement:  Good  Insight:  Good  Psychomotor Activity:  Normal  Concentration:   Concentration: Good  Recall:  Good  Fund of Knowledge: Good  Language: Good  Assets:  Desire for Improvement  ADL's:  Intact  Cognition: WNL  Prognosis:  Good    DIAGNOSES:    ICD-10-CM   1. Attention deficit hyperactivity disorder (ADHD), predominantly inattentive type F90.0   2. Generalized anxiety disorder F41.1   3. Major depressive disorder, recurrent episode, moderate (HCC) F33.1   4. Insomnia, unspecified type G47.00   5. Crohn's disease of colon without complication ([place]) U98.11     Receiving Psychotherapy: No    RECOMMENDATIONS: Reviewed the [place] controlled substance registry which is appropriate. Restart Adderall XR 15 mg.  I am giving her enough for twice a day but have encouraged her to take only 1 every morning and use a second 1 in the early afternoon only as needed. She knows not to take the Xanax with the Adderall and that I will not prescribe Xanax while she is taking the Adderall.  If the anxiety worsens then will have to find another way to treat it besides using a benzo.  She verbalizes understanding. Continue trazodone as needed Return in 3 months, after her training is over.  Call if any problems before then.  Donnal Moat, PA-C

## 2018-08-11 ENCOUNTER — Other Ambulatory Visit: Payer: Self-pay | Admitting: Physician Assistant

## 2018-08-24 ENCOUNTER — Ambulatory Visit: Payer: Self-pay | Admitting: Physician Assistant

## 2018-08-25 ENCOUNTER — Encounter: Payer: Self-pay | Admitting: Emergency Medicine

## 2018-08-25 DIAGNOSIS — F411 Generalized anxiety disorder: Secondary | ICD-10-CM | POA: Insufficient documentation

## 2018-08-25 DIAGNOSIS — G4700 Insomnia, unspecified: Secondary | ICD-10-CM | POA: Insufficient documentation

## 2018-08-25 DIAGNOSIS — F909 Attention-deficit hyperactivity disorder, unspecified type: Secondary | ICD-10-CM | POA: Insufficient documentation

## 2018-09-14 ENCOUNTER — Ambulatory Visit: Payer: Self-pay | Admitting: Physician Assistant

## 2018-10-12 ENCOUNTER — Telehealth: Payer: Self-pay | Admitting: Physician Assistant

## 2018-10-12 ENCOUNTER — Other Ambulatory Visit: Payer: Self-pay | Admitting: Physician Assistant

## 2018-10-12 MED ORDER — AMPHETAMINE-DEXTROAMPHET ER 15 MG PO CP24: 15.0000 mg | ORAL_CAPSULE | Freq: Two times a day (BID) | ORAL | 0 refills | Status: DC | PRN

## 2018-10-12 NOTE — Telephone Encounter (Signed)
Patient stated her script for adderall is at [facility] on file but it's over an hour wait to get in the door would like to know if you can send the script to [facility] [address].

## 2018-10-14 ENCOUNTER — Telehealth: Payer: Self-pay

## 2018-10-14 NOTE — Telephone Encounter (Signed)
Prior Auth approved by [employer] for Adderall Xr 15 Mg 10/13/2018-10/13/2019.

## 2018-11-08 ENCOUNTER — Ambulatory Visit: Payer: Self-pay | Admitting: Physician Assistant

## 2018-11-16 ENCOUNTER — Ambulatory Visit: Payer: Self-pay | Admitting: Physician Assistant

## 2018-11-16 ENCOUNTER — Encounter: Payer: Self-pay | Admitting: Physician Assistant

## 2018-11-16 ENCOUNTER — Other Ambulatory Visit: Payer: Self-pay

## 2018-11-16 ENCOUNTER — Ambulatory Visit: Payer: No Typology Code available for payment source | Admitting: Physician Assistant

## 2018-11-16 DIAGNOSIS — G2581 Restless legs syndrome: Secondary | ICD-10-CM | POA: Diagnosis not present

## 2018-11-16 DIAGNOSIS — F900 Attention-deficit hyperactivity disorder, predominantly inattentive type: Secondary | ICD-10-CM

## 2018-11-16 DIAGNOSIS — F411 Generalized anxiety disorder: Secondary | ICD-10-CM | POA: Diagnosis not present

## 2018-11-16 MED ORDER — ALPRAZOLAM 0.5 MG PO TABS: 0.2500 mg | ORAL_TABLET | Freq: Two times a day (BID) | ORAL | 0 refills | Status: DC | PRN

## 2018-11-16 NOTE — Progress Notes (Signed)
[facility]  Patient ID: [name],  MRN: [id number]  PCP: Patient, No Pcp Per  Date of Evaluation: 11/16/2018 Time spent:20 minutes  Chief Complaint:  Chief Complaint    Follow-up     Virtual Visit via Telephone Note  I connected with patient by a video enabled telemedicine application or telephone, with their informed consent, and verified patient privacy and that I am speaking with the correct person using two identifiers.  I am private, in my home and the patient is at home, in private.  I discussed the limitations, risks, security and privacy concerns of performing an evaluation and management service by telephone and the availability of in person appointments. I also discussed with the patient that there may be a patient responsible charge related to this service. The patient expressed understanding and agreed to proceed.   I discussed the assessment and treatment plan with the patient. The patient was provided an opportunity to ask questions and all were answered. The patient agreed with the plan and demonstrated an understanding of the instructions.   The patient was advised to call back or seek an in-person evaluation if the symptoms worsen or if the condition fails to improve as anticipated.  I provided 25 minutes of non-face-to-face time during this encounter.  HISTORY/CURRENT STATUS: HPI For 3 month med check.  More stress b/c new job at [employer].  Now working from home b/c of coronavirus pandemic. Her Mom w/ alzheimers is still living w/ her and doesn't know her.  She's sundowning and patient isn't getting much sleep.  Before the pandemic, they were planning on putting her in a home.  But can't now. Under a lot of pressure financially.  Is working full time w/ [employer] and has 3 part time jobs. She's about to have to file for bankruptcy.  Husband isn't working and he 'doesn't worry about anything.'   Pt starting to have RLS again.  Very anxious too.   Not taking the Adderall twice qd, not usually.  Hours are 11-8 now.  Patient denies loss of interest in usual activities and is able to enjoy things.  Denies decreased energy or motivation.  Appetite has not changed.  No extreme sadness, tearfulness, or feelings of hopelessness.  Denies any changes in concentration, making decisions or remembering things.  Denies suicidal or homicidal thoughts.  Denies muscle or joint pain, stiffness, or dystonia.  Denies dizziness, syncope, seizures, numbness, tingling, tremor, tics, unsteady gait, slurred speech, confusion.   Individual Medical History/ Review of Systems: Changes? :No    Past medications for mental health diagnoses include: Paxil, Prozac, Lexapro, Celexa, Cymbalta, Abilify, Vistaril, Strattera, Xanax, Klonopin, Elavil, Adderall XR  Allergies: Sulfa antibiotics  Current Medications:  Current Outpatient Medications:  .  ALPRAZolam (XANAX) 0.5 MG tablet, Take 0.5-1 tablets (0.25-0.5 mg total) by mouth 2 (two) times daily as needed for anxiety., Disp: 30 tablet, Rfl: 0 .  amphetamine-dextroamphetamine (ADDERALL XR) 15 MG 24 hr capsule, Take 1 capsule by mouth 2 (two) times daily as needed., Disp: 60 capsule, Rfl: 0 .  amphetamine-dextroamphetamine (ADDERALL XR) 15 MG 24 hr capsule, Take 1 capsule by mouth 2 (two) times daily as needed., Disp: 60 capsule, Rfl: 0 .  amphetamine-dextroamphetamine (ADDERALL XR) 15 MG 24 hr capsule, Take 1 capsule by mouth 2 (two) times daily as needed., Disp: 60 capsule, Rfl: 0 .  estradiol (ESTRACE) 0.5 MG tablet, Take 0.5 mg by mouth daily., Disp: , Rfl:  .  traZODone (DESYREL) 100 MG  tablet, Take 100-200 mg by mouth at bedtime as needed for sleep., Disp: , Rfl:  Medication Side Effects: none  Family Medical/ Social History: Changes? Yes working from home d/t Coronavirus pandemic   MENTAL HEALTH EXAM:  There were no vitals taken for this visit.There is no height or weight on file to calculate BMI.  General  Appearance: phone visit, unable to assess.  Eye Contact:  unable to assess  Speech:  Clear and Coherent  Volume:  Normal  Mood:  Anxious  Affect:  unable to assess  Thought Process:  Goal Directed  Orientation:  Full (Time, Place, and Person)  Thought Content: Logical   Suicidal Thoughts:  No  Homicidal Thoughts:  No  Memory:  WNL  Judgement:  Good  Insight:  Good  Psychomotor Activity:  unable to assess  Concentration:  Concentration: Good  Recall:  Good  Fund of Knowledge: Good  Language: Good  Assets:  Desire for Improvement  ADL's:  Intact  Cognition: WNL  Prognosis:  Good    DIAGNOSES:    ICD-10-CM   1. Attention deficit hyperactivity disorder (ADHD), predominantly inattentive type F90.0   2. Generalized anxiety disorder F41.1   3. Restless leg syndrome G25.81     Receiving Psychotherapy: No    RECOMMENDATIONS: I spent 25 minutes with her and 50% of that time was in discussion of diagnosis and treatment options. Restart Xanax 0.5 1/2-1 bid prn anxiety. She knows we won't continue to do this and the Adderall too.  She really needs the Xanax right now however and she can take it for the restless leg syndrome as well.  If that continues though she should call in a week or 2 and I will prescribe gabapentin.  She verbalizes understanding. Continue Adderall XR 15 mg 1 p.o. twice daily as needed. She is not needing the trazodone right now. Return in 6 weeks.   Donnal Moat, PA-C   This record has been created using [employer].  Chart creation errors have been sought, but may not always have been located and corrected. Such creation errors do not reflect on the standard of medical care.

## 2018-12-03 ENCOUNTER — Other Ambulatory Visit: Payer: Self-pay | Admitting: Physician Assistant

## 2018-12-03 ENCOUNTER — Telehealth: Payer: Self-pay | Admitting: Physician Assistant

## 2018-12-03 MED ORDER — VARENICLINE TARTRATE 0.5 MG X 11 & 1 MG X 42 PO MISC: ORAL | 0 refills | Status: DC

## 2018-12-03 MED ORDER — AMPHETAMINE-DEXTROAMPHET ER 15 MG PO CP24: 15.0000 mg | ORAL_CAPSULE | Freq: Two times a day (BID) | ORAL | 0 refills | Status: DC | PRN

## 2018-12-03 NOTE — Progress Notes (Signed)
Pt requests Chantix.  We had discussed in the past.  See paper chart will prescribe first months pack and will see her back

## 2018-12-03 NOTE — Telephone Encounter (Signed)
rxs sent

## 2018-12-03 NOTE — Telephone Encounter (Signed)
Patient need refill on Adderall XR and Chantix to be sent to [facility] in [place]

## 2018-12-25 ENCOUNTER — Other Ambulatory Visit: Payer: Self-pay | Admitting: Physician Assistant

## 2018-12-28 ENCOUNTER — Telehealth: Payer: Self-pay | Admitting: Physician Assistant

## 2018-12-28 ENCOUNTER — Other Ambulatory Visit: Payer: Self-pay | Admitting: Physician Assistant

## 2018-12-28 MED ORDER — ALPRAZOLAM 0.5 MG PO TABS: 0.2500 mg | ORAL_TABLET | Freq: Two times a day (BID) | ORAL | 0 refills | Status: DC | PRN

## 2018-12-28 NOTE — Telephone Encounter (Signed)
[name] called to request a refill on her Xanax.  Appt. 6/5/.  Send to [facility] in [place].

## 2018-12-28 NOTE — Telephone Encounter (Signed)
Pt needs

## 2018-12-28 NOTE — Telephone Encounter (Signed)
It was sent in

## 2018-12-31 ENCOUNTER — Other Ambulatory Visit: Payer: Self-pay

## 2018-12-31 ENCOUNTER — Encounter: Payer: Self-pay | Admitting: Physician Assistant

## 2018-12-31 ENCOUNTER — Ambulatory Visit (INDEPENDENT_AMBULATORY_CARE_PROVIDER_SITE_OTHER): Payer: No Typology Code available for payment source | Admitting: Physician Assistant

## 2018-12-31 DIAGNOSIS — F17200 Nicotine dependence, unspecified, uncomplicated: Secondary | ICD-10-CM

## 2018-12-31 DIAGNOSIS — F900 Attention-deficit hyperactivity disorder, predominantly inattentive type: Secondary | ICD-10-CM

## 2018-12-31 DIAGNOSIS — G4700 Insomnia, unspecified: Secondary | ICD-10-CM

## 2018-12-31 DIAGNOSIS — G2581 Restless legs syndrome: Secondary | ICD-10-CM

## 2018-12-31 DIAGNOSIS — F411 Generalized anxiety disorder: Secondary | ICD-10-CM | POA: Diagnosis not present

## 2018-12-31 MED ORDER — VARENICLINE TARTRATE 1 MG PO TABS: 1.0000 mg | ORAL_TABLET | Freq: Two times a day (BID) | ORAL | 1 refills | Status: DC

## 2018-12-31 MED ORDER — AMPHETAMINE-DEXTROAMPHET ER 15 MG PO CP24: 15.0000 mg | ORAL_CAPSULE | Freq: Two times a day (BID) | ORAL | 0 refills | Status: DC | PRN

## 2018-12-31 NOTE — Progress Notes (Signed)
[facility]  Patient ID: [name],  MRN: [id number]  PCP: Patient, No Pcp Per  Date of Evaluation: 12/31/2018 Time spent:15 minutes  Chief Complaint:  Chief Complaint    Stress; Anxiety; ADD; Insomnia     Virtual Visit via Telephone Note  I connected with patient by a video enabled telemedicine application or telephone, with their informed consent, and verified patient privacy and that I am speaking with the correct person using two identifiers.  I am private, at [place] and the patient is in her car.   I discussed the limitations, risks, security and privacy concerns of performing an evaluation and management service by telephone and the availability of in person appointments. I also discussed with the patient that there may be a patient responsible charge related to this service. The patient expressed understanding and agreed to proceed.   I discussed the assessment and treatment plan with the patient. The patient was provided an opportunity to ask questions and all were answered. The patient agreed with the plan and demonstrated an understanding of the instructions.   The patient was advised to call back or seek an in-person evaluation if the symptoms worsen or if the condition fails to improve as anticipated.  I provided 25 minutes of non-face-to-face time during this encounter.  HISTORY/CURRENT STATUS: HPI Not doing great.  Mom is a lot worse.  Dementia, doesn't remember her.  Pt is trying to get her in a home but b/c of COVID, no one is taking new admissions.  Mom is argumentative and not cooperative.  "It's very difficult to live with." Mom isn't sleeping well and pt only gets about 2 hours of sleep. Pt is very stressed.  Working 3 jobs.  Stays very tired.    Due to the stress and anxiety, she is needing Xanax at times.  Reports that she is taking as sparingly as she possibly can.  We started the Chantix a couple of weeks ago.  She has not smoked a  cigarette in 4 days now.  Having no side effects from the Chantix.  Is very excited that she has not had a cigarette!  She does get very sad due to the circumstances with her mom.  Does not really have time to enjoy anything.  Not able to ride her horse on the weekends because she is doing laundry, cooking, and taking care of her mom.  Denies suicidal or homicidal thoughts.  Denies dizziness, syncope, seizures, numbness, tingling, tremor, tics, unsteady gait, slurred speech, confusion. Denies muscle or joint pain, stiffness, or dystonia.  Individual Medical History/ Review of Systems: Changes? :No    Past medications for mental health diagnoses include: Paxil, Prozac, Lexapro, Celexa, Cymbalta, Abilify, Vistaril, Strattera, Xanax, Klonopin, Elavil, Adderall XR  Allergies: Sulfa antibiotics  Current Medications:  Current Outpatient Medications:  .  ALPRAZolam (XANAX) 0.5 MG tablet, Take 0.5-1 tablets (0.25-0.5 mg total) by mouth 2 (two) times daily as needed for anxiety., Disp: 30 tablet, Rfl: 0 .  [START ON 02/28/2019] amphetamine-dextroamphetamine (ADDERALL XR) 15 MG 24 hr capsule, Take 1 capsule by mouth 2 (two) times daily as needed., Disp: 60 capsule, Rfl: 0 .  [START ON 01/29/2019] amphetamine-dextroamphetamine (ADDERALL XR) 15 MG 24 hr capsule, Take 1 capsule by mouth 2 (two) times daily as needed., Disp: 60 capsule, Rfl: 0 .  amphetamine-dextroamphetamine (ADDERALL XR) 15 MG 24 hr capsule, Take 1 capsule by mouth 2 (two) times daily as needed., Disp: 60 capsule, Rfl: 0 .  estradiol (ESTRACE) 0.5  MG tablet, Take 0.5 mg by mouth daily., Disp: , Rfl:  .  traZODone (DESYREL) 100 MG tablet, Take 100-200 mg by mouth at bedtime as needed for sleep., Disp: , Rfl:  .  varenicline (CHANTIX STARTING MONTH PAK) 0.5 MG X 11 & 1 MG X 42 tablet, Take one 0.5 mg tablet by mouth once daily for 3 days, then 0.5 mg tablet twice daily for 4 days, then  1 mg tablet twice daily., Disp: 53 tablet, Rfl: 0 .   varenicline (CHANTIX CONTINUING MONTH PAK) 1 MG tablet, Take 1 tablet (1 mg total) by mouth 2 (two) times daily., Disp: 60 tablet, Rfl: 1 Medication Side Effects: none  Family Medical/ Social History: Changes? Yes see above  MENTAL HEALTH EXAM:  There were no vitals taken for this visit.There is no height or weight on file to calculate BMI.  General Appearance: Unable to assess  Eye Contact:  Unable to assess  Speech:  Clear and Coherent  Volume:  Normal  Mood:  Anxious  Affect:  Unable to assess  Thought Process:  Goal Directed  Orientation:  Full (Time, Place, and Person)  Thought Content: Logical   Suicidal Thoughts:  No  Homicidal Thoughts:  No  Memory:  WNL  Judgement:  Good  Insight:  Good  Psychomotor Activity:  Unable to assess  Concentration:  Concentration: Good and Attention Span: Good  Recall:  Good  Fund of Knowledge: Good  Language: Good  Assets:  Desire for Improvement  ADL's:  Intact  Cognition: WNL  Prognosis:  Good    DIAGNOSES:    ICD-10-CM   1. Generalized anxiety disorder F41.1   2. Attention deficit hyperactivity disorder (ADHD), predominantly inattentive type F90.0   3. Restless leg syndrome G25.81   4. Insomnia, unspecified type G47.00   5. Smoker F17.200     Receiving Psychotherapy: No    RECOMMENDATIONS: I spent 25 minutes with her and 50% of that time was in counseling. I am really happy this Chantix seems to be working! Continue Chantix. Continue Xanax 0.5 mg 1/2-1 p.o. twice daily as needed.  She knows to continue using sparingly. Continue Adderall XR 15 mg twice daily. Continue trazodone 100 mg, 1-2 nightly as needed. Sleep hygiene discussed. We both agree that until she is able to have her mom in a memory care facility probably things will not improve for the patient.  Hopefully once the coronavirus restrictions are lifted, she can get her mom placed and then the patient can get some sleep and stress will be relieved. Recommend  therapy. Return in 2 months.   Donnal Moat, PA-C   This record has been created using [employer].  Chart creation errors have been sought, but may not always have been located and corrected. Such creation errors do not reflect on the standard of medical care.

## 2019-01-24 ENCOUNTER — Other Ambulatory Visit: Payer: Self-pay | Admitting: Physician Assistant

## 2019-01-24 NOTE — Telephone Encounter (Signed)
[name] called to request refill of her Xanax.  Next appt 02/25/19 and is on CXL.  Life has been very difficult.  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  She is outsider lying the grass right now.  [name] is also working from home and it has become very trying.  She can't get her mom in a facility due to Covid.  Please sned the refill to [facility] in [place].

## 2019-02-25 ENCOUNTER — Encounter

## 2019-02-25 ENCOUNTER — Ambulatory Visit (INDEPENDENT_AMBULATORY_CARE_PROVIDER_SITE_OTHER): Payer: No Typology Code available for payment source | Admitting: Physician Assistant

## 2019-02-25 ENCOUNTER — Encounter: Payer: Self-pay | Admitting: Physician Assistant

## 2019-02-25 ENCOUNTER — Other Ambulatory Visit: Payer: Self-pay

## 2019-02-25 DIAGNOSIS — F411 Generalized anxiety disorder: Secondary | ICD-10-CM | POA: Diagnosis not present

## 2019-02-25 DIAGNOSIS — G2581 Restless legs syndrome: Secondary | ICD-10-CM | POA: Diagnosis not present

## 2019-02-25 DIAGNOSIS — F900 Attention-deficit hyperactivity disorder, predominantly inattentive type: Secondary | ICD-10-CM

## 2019-02-25 DIAGNOSIS — F17200 Nicotine dependence, unspecified, uncomplicated: Secondary | ICD-10-CM

## 2019-02-25 DIAGNOSIS — F329 Major depressive disorder, single episode, unspecified: Secondary | ICD-10-CM

## 2019-02-25 DIAGNOSIS — G4700 Insomnia, unspecified: Secondary | ICD-10-CM

## 2019-02-25 MED ORDER — ALPRAZOLAM 0.5 MG PO TABS: ORAL_TABLET | ORAL | 1 refills | Status: DC

## 2019-02-25 MED ORDER — AMPHETAMINE-DEXTROAMPHET ER 15 MG PO CP24: 15.0000 mg | ORAL_CAPSULE | Freq: Two times a day (BID) | ORAL | 0 refills | Status: DC | PRN

## 2019-02-25 MED ORDER — TRAZODONE HCL 100 MG PO TABS: 100.0000 mg | ORAL_TABLET | Freq: Every day | ORAL | 0 refills | Status: DC

## 2019-02-25 NOTE — Progress Notes (Signed)
[facility]  Patient ID: [name],  MRN: [id number]  PCP: Patient, No Pcp Per  Date of Evaluation: 02/25/2019 Time spent:25 minutes  Chief Complaint:  Chief Complaint    Follow-up     Virtual Visit via Telephone Note  I connected with patient by a video enabled telemedicine application or telephone, with their informed consent, and verified patient privacy and that I am speaking with the correct person using two identifiers.  I am private, in my home and the patient is home.   I discussed the limitations, risks, security and privacy concerns of performing an evaluation and management service by telephone and the availability of in person appointments. I also discussed with the patient that there may be a patient responsible charge related to this service. The patient expressed understanding and agreed to proceed.   I discussed the assessment and treatment plan with the patient. The patient was provided an opportunity to ask questions and all were answered. The patient agreed with the plan and demonstrated an understanding of the instructions.   The patient was advised to call back or seek an in-person evaluation if the symptoms worsen or if the condition fails to improve as anticipated.  I provided 25 minutes of non-face-to-face time during this encounter.  HISTORY/CURRENT STATUS: HPI For routine f/u.  Her Mom is in [facility] home now.  Very upset.  Her sisters who haven't been there at all, are now being critical of her and the care that she has given their mom.  It has really hurt [name]'s feelings because 1 of the sisters has not even seen their mom in over a year.  Patient feels very guilty that she is unable to take care of her mom now and she does not want her to have to be in a hospice home.  Today is not a good day because she has not been able to see her mom in 3 days now and she was 5 minutes late today arriving at the hospice home and they would  not let her in.  She is in tears during our visit.  She is understandably needing more Xanax.  Has racing thoughts and is unable to let go of the hurtful things her sisters say.  States she is overwhelmed.  She is also working her 3 jobs as usual.  Not sleeping very well because she cannot get her mind to quiet down.  Feels extremely sad, and hopeless at the present time.  No suicidal or homicidal thoughts.  Denies dizziness, syncope, seizures, numbness, tingling, tremor, tics, unsteady gait, slurred speech, confusion. Denies muscle or joint pain, stiffness, or dystonia.  Individual Medical History/ Review of Systems: Changes? :No    Past medications for mental health diagnoses include: Paxil, Prozac, Lexapro, Celexa, Cymbalta, Abilify, Vistaril, Strattera, Xanax, Klonopin, Elavil, Adderall XR  Allergies: Sulfa antibiotics  Current Medications:  Current Outpatient Medications:  .  ALPRAZolam (XANAX) 0.5 MG tablet, TAKE 1/2 TO 1 (ONE-HALF TO ONE) TABLET BY MOUTH TWICE DAILY AS NEEDED FOR ANXIETY, Disp: 60 tablet, Rfl: 1 .  [START ON 02/28/2019] amphetamine-dextroamphetamine (ADDERALL XR) 15 MG 24 hr capsule, Take 1 capsule by mouth 2 (two) times daily as needed., Disp: 60 capsule, Rfl: 0 .  amphetamine-dextroamphetamine (ADDERALL XR) 15 MG 24 hr capsule, Take 1 capsule by mouth 2 (two) times daily as needed., Disp: 60 capsule, Rfl: 0 .  [START ON 03/08/2019] amphetamine-dextroamphetamine (ADDERALL XR) 15 MG 24 hr capsule, Take 1 capsule by mouth 2 (  two) times daily as needed., Disp: 60 capsule, Rfl: 0 .  estradiol (ESTRACE) 0.5 MG tablet, Take 0.5 mg by mouth daily., Disp: , Rfl:  .  traZODone (DESYREL) 100 MG tablet, Take 1-2 tablets (100-200 mg total) by mouth at bedtime., Disp: 60 tablet, Rfl: 0 .  varenicline (CHANTIX CONTINUING MONTH PAK) 1 MG tablet, Take 1 tablet (1 mg total) by mouth 2 (two) times daily., Disp: 60 tablet, Rfl: 1 .  varenicline (CHANTIX STARTING MONTH PAK) 0.5 MG X 11 & 1  MG X 42 tablet, Take one 0.5 mg tablet by mouth once daily for 3 days, then 0.5 mg tablet twice daily for 4 days, then  1 mg tablet twice daily., Disp: 53 tablet, Rfl: 0 Medication Side Effects: none  Family Medical/ Social History: Changes? Yes her mom who has end-stage dementia is now in a hospice home.  MENTAL HEALTH EXAM:  There were no vitals taken for this visit.There is no height or weight on file to calculate BMI.  General Appearance: unable to assess  Eye Contact:  unable to assess  Speech:  Clear and Coherent  Volume:  Normal  Mood:  Depressed  Affect:  Depressed and Tearful but consolable  Thought Process:  Goal Directed  Orientation:  Full (Time, Place, and Person)  Thought Content: Logical   Suicidal Thoughts:  No  Homicidal Thoughts:  No  Memory:  WNL  Judgement:  Good  Insight:  Good  Psychomotor Activity:  Unable to assess  Concentration:  Concentration: Good  Recall:  Good  Fund of Knowledge: Good  Language: Good  Assets:  Desire for Improvement  ADL's:  Intact  Cognition: WNL  Prognosis:  Good    DIAGNOSES:    ICD-10-CM   1. 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 Generalized anxiety disorder  F41.1   3. Attention deficit hyperactivity disorder (ADHD), predominantly inattentive type  F90.0   4. Restless leg syndrome  G25.81   5. Insomnia, unspecified type  G47.00   6. Smoker  F17.200     Receiving Psychotherapy: Yes EAP at work   RECOMMENDATIONS:  I spent 25 minutes with her and 50% of that time was in counseling concerning situational depression and anxiety and treatment options for that.  She has tried numerous antidepressants in the past and for 1 reason or another, they were discontinued.  She is tolerating the trazodone well so I recommend we use it for depression not only for sleep. Continue trazodone 100 to 200 mg nightly, but take routinely, not as needed, in hopes to help depression as well as sleep.  If she can tolerate the 200 mg  without causing grogginess the next day, that would be most beneficial. Continue Xanax 0.5 mg 1 p.o. twice daily as needed.  Even though she is on a stimulant PRN, she needs the Xanax desperately right now. Continue Adderall XR 15 mg 1 twice daily as needed. Restart Chantix when she is ready.  I do not think now is the time begin smoking cessation program. Continue EAP through her job. Return in 4 to 6 weeks.  Donnal Moat, PA-C   This record has been created using [employer].  Chart creation errors have been sought, but may not always have been located and corrected. Such creation errors do not reflect on the standard of medical care.

## 2019-03-23 ENCOUNTER — Other Ambulatory Visit: Payer: Self-pay | Admitting: Physician Assistant

## 2019-03-23 ENCOUNTER — Telehealth: Payer: Self-pay | Admitting: Physician Assistant

## 2019-03-23 MED ORDER — ALPRAZOLAM 0.5 MG PO TABS: 0.5000 mg | ORAL_TABLET | Freq: Three times a day (TID) | ORAL | 0 refills | Status: DC | PRN

## 2019-03-23 NOTE — Telephone Encounter (Signed)
Pt reminds you that her mother died and she has been taking more Xanax. She is taking 1 pill three times a day, most days and now she can't get it refilled because it is out early. Can you call in the Xanax for early refill and can she get the 3 pills per day? [facility] [place], [place].

## 2019-03-23 NOTE — Telephone Encounter (Signed)
Please call [facility] in [place] and have them HOLD Adderall Rxs until we okay them.   Please call her and let her know that as well.  She knows she can't take that AND the xanax, and I see that she's been filling the Adderall every month for the past 3 months at least.  She needs the Xanax more right now, so I only want her taking the Adderall in RARE cases.  I've sent in Xanax.   Needs to make appt w/ me.

## 2019-03-24 NOTE — Telephone Encounter (Signed)
ok 

## 2019-04-05 ENCOUNTER — Other Ambulatory Visit: Payer: Self-pay

## 2019-04-05 ENCOUNTER — Telehealth: Payer: Self-pay | Admitting: Physician Assistant

## 2019-04-05 NOTE — Telephone Encounter (Signed)
Last refill 08/12, pended for approval

## 2019-04-05 NOTE — Telephone Encounter (Signed)
Pt needs refill on Adderall sent to [facility] in [place].

## 2019-04-11 ENCOUNTER — Other Ambulatory Visit: Payer: Self-pay

## 2019-04-11 ENCOUNTER — Telehealth: Payer: Self-pay | Admitting: Physician Assistant

## 2019-04-11 NOTE — Telephone Encounter (Signed)
Last refill 03/09/2019 Pended for approval

## 2019-04-11 NOTE — Telephone Encounter (Signed)
Pt needs [facility] on Adder[facility] 34m. [facility] [place]

## 2019-04-13 ENCOUNTER — Telehealth: Payer: Self-pay | Admitting: Physician Assistant

## 2019-04-13 NOTE — Telephone Encounter (Signed)
Patient need refill on Adderall to be sent to [facility] in [place] has appt, in October was out of work now back at work need to get focused

## 2019-04-14 ENCOUNTER — Other Ambulatory Visit: Payer: Self-pay

## 2019-04-14 NOTE — Telephone Encounter (Signed)
Last refill 03/09/2019 Last appt 02/25/2019 Pended for approval

## 2019-04-14 NOTE — Telephone Encounter (Signed)
[name], please call the pt and let her know I'm not filling the Adderall b/c she's taking a benzo too, which is more important for her right now.  This is the 3rd time I've denied the Adderall.  We'll discuss at the next visit.

## 2019-04-14 NOTE — Telephone Encounter (Signed)
Pt. Did not answer so I left her a VM to return my call.

## 2019-04-15 NOTE — Telephone Encounter (Signed)
Pt. Returned call. Pt. Made Aware and is a little upset. She states that she isn't able to focus at work and her performance rate is dropping. I told her you will discuss it with her at next appt.

## 2019-04-18 NOTE — Telephone Encounter (Signed)
Reviewed

## 2019-05-06 ENCOUNTER — Encounter: Payer: Self-pay | Admitting: Physician Assistant

## 2019-05-06 ENCOUNTER — Ambulatory Visit (INDEPENDENT_AMBULATORY_CARE_PROVIDER_SITE_OTHER): Payer: No Typology Code available for payment source | Admitting: Physician Assistant

## 2019-05-06 ENCOUNTER — Other Ambulatory Visit: Payer: Self-pay

## 2019-05-06 DIAGNOSIS — F4321 Adjustment disorder with depressed mood: Secondary | ICD-10-CM

## 2019-05-06 DIAGNOSIS — G2581 Restless legs syndrome: Secondary | ICD-10-CM

## 2019-05-06 DIAGNOSIS — F411 Generalized anxiety disorder: Secondary | ICD-10-CM | POA: Diagnosis not present

## 2019-05-06 DIAGNOSIS — G4700 Insomnia, unspecified: Secondary | ICD-10-CM

## 2019-05-06 DIAGNOSIS — F17200 Nicotine dependence, unspecified, uncomplicated: Secondary | ICD-10-CM

## 2019-05-06 DIAGNOSIS — F900 Attention-deficit hyperactivity disorder, predominantly inattentive type: Secondary | ICD-10-CM

## 2019-05-06 MED ORDER — AMPHETAMINE-DEXTROAMPHET ER 15 MG PO CP24: 15.0000 mg | ORAL_CAPSULE | Freq: Two times a day (BID) | ORAL | 0 refills | Status: DC | PRN

## 2019-05-06 MED ORDER — ALPRAZOLAM 0.5 MG PO TABS: 0.5000 mg | ORAL_TABLET | Freq: Three times a day (TID) | ORAL | 0 refills | Status: DC | PRN

## 2019-05-06 MED ORDER — DESVENLAFAXINE SUCCINATE ER 50 MG PO TB24: 50.0000 mg | ORAL_TABLET | Freq: Every day | ORAL | 1 refills | Status: DC

## 2019-05-06 NOTE — Progress Notes (Signed)
[facility]  Patient ID: [name],  MRN: [id number]  PCP: Patient, No Pcp Per  Date of Evaluation: 05/06/2019 Time spent:25 minutes  Chief Complaint:  Chief Complaint    Depression     Virtual Visit via Telephone Note  I connected with patient by a video enabled telemedicine application or telephone, with their informed consent, and verified patient privacy and that I am speaking with the correct person using two identifiers.  I am private, in my office and the patient is home.   I discussed the limitations, risks, security and privacy concerns of performing an evaluation and management service by telephone and the availability of in person appointments. I also discussed with the patient that there may be a patient responsible charge related to this service. The patient expressed understanding and agreed to proceed.   I discussed the assessment and treatment plan with the patient. The patient was provided an opportunity to ask questions and all were answered. The patient agreed with the plan and demonstrated an understanding of the instructions.   The patient was advised to call back or seek an in-person evaluation if the symptoms worsen or if the condition fails to improve as anticipated.  I provided 25 minutes of non-face-to-face time during this encounter.  HISTORY/CURRENT STATUS: HPI For med RF.  Not doing well.  I did a 3-way call w/ her therapist, Westley Chandler  ([phone]) [facility], at [facility] request.  Manuela Schwartz set up the three-way call herself.  Mom died 2019-03-11. Pt is seeing a grief counselor (as above) She is having an extremely hard time with this.  She was with her mom when she died and states watching her pass was very difficult.  Her mom had a very frightened look in her eyes and [name] is having a hard time getting that image out of her head.  She is talking with her therapist at least once a week and sometimes more.  He told me he is working on  decreasing her anxiety by using EMDR and yoga techniques.  Deyra's sisters have not been nice about their mom.  One of them got her cremains and would not let Michaila have them until later.  "It is just been a nightmare.  I am trying to look at it as though mom is better off because she had a really hard time these past few years.  But it is so hard dealing with family members who treat me badly and are so disrespectful."  Patient is having a hard time sleeping.  She has not been using the trazodone because she forgot she could use it with her other medications.  Even when she does sleep, she does not feel rested when she awakens.  Work has been very difficult as well.  She cannot concentrate.  She has been overlooked for a promotion.  Her production is down.  She asked to have the Adderall back so that she can focus.  Legrand Como, her therapist, states he is working with her to help with concentration.  Denies dizziness, syncope, seizures, numbness, tingling, tremor, tics, unsteady gait, slurred speech, confusion. Denies muscle or joint pain, stiffness, or dystonia.  Individual Medical History/ Review of Systems: Changes? :No   No fevers, chills, weight loss, or night sweats.  Denies headache, blurred vision.  No sore throat or earache, no trouble swallowing, chest pain, palpitations, shortness of breath, abdominal pain, nausea or vomiting, constipation or diarrhea, or urinary tract symptoms.  Past medications for mental health diagnoses  include: Paxil, Prozac, Lexapro, Celexa, Cymbalta, Abilify, Vistaril, Strattera, Xanax, Klonopin, Elavil, Adderall XR  Allergies: Sulfa antibiotics  Current Medications:  Current Outpatient Medications:  .  ALPRAZolam (XANAX) 0.5 MG tablet, Take 1 tablet (0.5 mg total) by mouth 3 (three) times daily as needed for anxiety., Disp: 90 tablet, Rfl: 0 .  estradiol (ESTRACE) 0.5 MG tablet, Take 0.5 mg by mouth daily., Disp: , Rfl:  .  traZODone (DESYREL) 100 MG tablet,  Take 1-2 tablets (100-200 mg total) by mouth at bedtime., Disp: 60 tablet, Rfl: 0 .  amphetamine-dextroamphetamine (ADDERALL XR) 15 MG 24 hr capsule, Take 1 capsule by mouth 2 (two) times daily as needed. (Patient not taking: Reported on 05/06/2019), Disp: 60 capsule, Rfl: 0 .  amphetamine-dextroamphetamine (ADDERALL XR) 15 MG 24 hr capsule, Take 1 capsule by mouth 2 (two) times daily as needed. (Patient not taking: Reported on 05/06/2019), Disp: 60 capsule, Rfl: 0 .  amphetamine-dextroamphetamine (ADDERALL XR) 15 MG 24 hr capsule, Take 1 capsule by mouth 2 (two) times daily as needed., Disp: 60 capsule, Rfl: 0 .  desvenlafaxine (PRISTIQ) 50 MG 24 hr tablet, Take 1 tablet (50 mg total) by mouth daily., Disp: 30 tablet, Rfl: 1 .  varenicline (CHANTIX CONTINUING MONTH PAK) 1 MG tablet, Take 1 tablet (1 mg total) by mouth 2 (two) times daily. (Patient not taking: Reported on 05/06/2019), Disp: 60 tablet, Rfl: 1 .  varenicline (CHANTIX STARTING MONTH PAK) 0.5 MG X 11 & 1 MG X 42 tablet, Take one 0.5 mg tablet by mouth once daily for 3 days, then 0.5 mg tablet twice daily for 4 days, then  1 mg tablet twice daily. (Patient not taking: Reported on 05/06/2019), Disp: 53 tablet, Rfl: 0 Medication Side Effects: none  Family Medical/ Social History: Changes? Yes Her Mom died 03-11-19.  She had dementia.   MENTAL HEALTH EXAM:  There were no vitals taken for this visit.There is no height or weight on file to calculate BMI.  General Appearance: unable to assess  Eye Contact:  unable to assess  Speech:  Clear and Coherent  Volume:  Normal  Mood:  Depressed  Affect:  unable to assess pt is sobbing but is consolable and quits crying before end of visit  Thought Process:  Goal Directed and Descriptions of Associations: Intact  Orientation:  Full (Time, Place, and Person)  Thought Content: Logical   Suicidal Thoughts:  No  Homicidal Thoughts:  No  Memory:  WNL  Judgement:  Good  Insight:  Good  Psychomotor  Activity:  unable to assess  Concentration:  Concentration: Fair and Attention Span: Fair  Recall:  Good  Fund of Knowledge: Good  Language: Good  Assets:  Desire for Improvement  ADL's:  Intact  Cognition: WNL  Prognosis:  Good    DIAGNOSES:    ICD-10-CM   1. Grief  F43.21   2. Generalized anxiety disorder  F41.1   3. Restless leg syndrome  G25.81   4. Attention deficit hyperactivity disorder (ADHD), predominantly inattentive type  F90.0   5. Insomnia, unspecified type  G47.00   6. Smoker  F17.200     Receiving Psychotherapy: Yes Westley Chandler, a grief counselor, [phone]).  Avi will sign a records release form for him and he will fax that to our office.   RECOMMENDATIONS:  The 3 of [place] had a discussion about her medications.  It is time that she is on an antidepressant.  Patient is willing and agrees.  This will help to  treat and prevent depression and anxiety. Start Pristiq 50 mg 1 p.o. daily.  Benefits, risks, side effects were discussed and she accepts. For now, I will prescribe both a stimulant and a benzo.  She is well aware, as we have discussed this many times, that I will not do this for any long period of time.  Legrand Como stated that hopefully with counseling, she will be able to cope with the anxiety as well as the ADHD and not need both meds, and can hopefully use whichever med we choose, as needed.  Patient agrees with that as well. Restart Adderall XR 15 mg 1 twice daily as needed. Continue Xanax 0.5 mg 1 3 times daily as needed. Continue therapy with Westley Chandler Return in 4 weeks.  Donnal Moat, PA-C

## 2019-06-03 ENCOUNTER — Other Ambulatory Visit: Payer: Self-pay | Admitting: Physician Assistant

## 2019-06-03 NOTE — Telephone Encounter (Signed)
Apt 11/09

## 2019-06-06 ENCOUNTER — Ambulatory Visit (INDEPENDENT_AMBULATORY_CARE_PROVIDER_SITE_OTHER): Payer: No Typology Code available for payment source | Admitting: Physician Assistant

## 2019-06-06 ENCOUNTER — Other Ambulatory Visit: Payer: Self-pay

## 2019-06-06 ENCOUNTER — Encounter: Payer: Self-pay | Admitting: Physician Assistant

## 2019-06-06 DIAGNOSIS — G4700 Insomnia, unspecified: Secondary | ICD-10-CM | POA: Diagnosis not present

## 2019-06-06 DIAGNOSIS — G2581 Restless legs syndrome: Secondary | ICD-10-CM | POA: Diagnosis not present

## 2019-06-06 DIAGNOSIS — F411 Generalized anxiety disorder: Secondary | ICD-10-CM

## 2019-06-06 DIAGNOSIS — F900 Attention-deficit hyperactivity disorder, predominantly inattentive type: Secondary | ICD-10-CM | POA: Diagnosis not present

## 2019-06-06 DIAGNOSIS — F329 Major depressive disorder, single episode, unspecified: Secondary | ICD-10-CM

## 2019-06-06 DIAGNOSIS — F4321 Adjustment disorder with depressed mood: Secondary | ICD-10-CM

## 2019-06-06 MED ORDER — AMPHETAMINE-DEXTROAMPHET ER 15 MG PO CP24: 15.0000 mg | ORAL_CAPSULE | Freq: Two times a day (BID) | ORAL | 0 refills | Status: DC | PRN

## 2019-06-06 MED ORDER — ALPRAZOLAM 0.5 MG PO TABS: ORAL_TABLET | ORAL | 0 refills | Status: DC

## 2019-06-06 MED ORDER — BUSPIRONE HCL 15 MG PO TABS: ORAL_TABLET | ORAL | 1 refills | Status: DC

## 2019-06-06 MED ORDER — TRAZODONE HCL 100 MG PO TABS: 100.0000 mg | ORAL_TABLET | Freq: Every day | ORAL | 0 refills | Status: DC

## 2019-06-06 NOTE — Progress Notes (Signed)
[facility]  Patient ID: [name],  MRN: [id number]  PCP: Patient, No Pcp Per  Date of Evaluation: 06/06/2019 Time spent:30 minutes  Chief Complaint:  Chief Complaint    Anxiety; Depression; Follow-up     Virtual Visit via Telephone Note  I connected with patient by a video enabled telemedicine application or telephone, with their informed consent, and verified patient privacy and that I am speaking with the correct person using two identifiers.  I am private, in my office and the patient is home.   I discussed the limitations, risks, security and privacy concerns of performing an evaluation and management service by telephone and the availability of in person appointments. I also discussed with the patient that there may be a patient responsible charge related to this service. The patient expressed understanding and agreed to proceed.   I discussed the assessment and treatment plan with the patient. The patient was provided an opportunity to ask questions and all were answered. The patient agreed with the plan and demonstrated an understanding of the instructions.   The patient was advised to call back or seek an in-person evaluation if the symptoms worsen or if the condition fails to improve as anticipated.  I provided 30 minutes of non-face-to-face time during this encounter.  HISTORY/CURRENT STATUS: HPI For routine med check.  Still having a lot of anxiety.  "Taking the Xanax like clockwork.  If I don't have it, I can't function." Grieving and still cries a lot.  Worse at night.  States she is working with her counselor to help with the anxiety.  Some of the techniques that she is learning are very helpful.  Also can't focus, without the Adderall.  "It helps a lot. I don't want to go without."  She is unable to be as productive and finish projects when she is not on the Adderall.  States it is difficult to say whether she is depressed or some normal grief after  losing her mother.  She is working full-time.  Motivation is low at times but again most of the problems that she feels are in the evening when it gets dark and she has more time to think about things.  Denies suicidal or homicidal thoughts.  Patient denies increased energy with decreased need for sleep, no increased talkativeness, no racing thoughts, no impulsivity or risky behaviors, no increased spending, no increased libido, no grandiosity.  Denies dizziness, syncope, seizures, numbness, tingling, tremor, unsteady gait, slurred speech, confusion.  Reported approximately 12 to 24 hours of her neck jerking uncontrollably a few days ago.  That had never happened before. Denies muscle or joint pain, stiffness, or dystonia.  Individual Medical History/ Review of Systems: Changes? :No    Past medications for mental health diagnoses include: Paxil, Prozac, Lexapro, Celexa, Cymbalta, Abilify, Vistaril, Strattera, Xanax, Klonopin, Elavil, Adderall XR, Clonidine.   Allergies: Sulfa antibiotics  Current Medications:  Current Outpatient Medications:  .  ALPRAZolam (XANAX) 0.5 MG tablet, 2.5 pills total qd for 2 weeks, then 2 pills qd for 2 weeks, then 1.5 pills qd for 2 weeks, then 1 qd for 2 weeks, then 1/2 pill qd for 2 wks then stop., Disp: 90 tablet, Rfl: 0 .  amphetamine-dextroamphetamine (ADDERALL XR) 15 MG 24 hr capsule, Take 1 capsule by mouth 2 (two) times daily as needed., Disp: 60 capsule, Rfl: 0 .  amphetamine-dextroamphetamine (ADDERALL XR) 15 MG 24 hr capsule, Take 1 capsule by mouth 2 (two) times daily as needed., Disp: 60 capsule,  Rfl: 0 .  amphetamine-dextroamphetamine (ADDERALL XR) 15 MG 24 hr capsule, Take 1 capsule by mouth 2 (two) times daily as needed., Disp: 60 capsule, Rfl: 0 .  desvenlafaxine (PRISTIQ) 50 MG 24 hr tablet, Take 1 tablet (50 mg total) by mouth daily., Disp: 30 tablet, Rfl: 1 .  estradiol (ESTRACE) 0.5 MG tablet, Take 0.5 mg by mouth daily., Disp: , Rfl:  .   traZODone (DESYREL) 100 MG tablet, Take 1-2 tablets (100-200 mg total) by mouth at bedtime., Disp: 60 tablet, Rfl: 0 .  varenicline (CHANTIX CONTINUING MONTH PAK) 1 MG tablet, Take 1 tablet (1 mg total) by mouth 2 (two) times daily., Disp: 60 tablet, Rfl: 1 .  busPIRone (BUSPAR) 15 MG tablet, 1/3 po bid for 1 week, then 2/3 po bid for 1 week, then 1 po bid., Disp: 60 tablet, Rfl: 1 Medication Side Effects: none  Family Medical/ Social History: Changes? No  MENTAL HEALTH EXAM:  There were no vitals taken for this visit.There is no height or weight on file to calculate BMI.  General Appearance: unable to assess  Eye Contact:  unable to assess  Speech:  Clear and Coherent  Volume:  Normal  Mood:  Anxious and Depressed  Affect:  Tearful and Anxious became very upset when we discussed that she will either have to be weaned off of the Adderall or the Xanax.    Thought Process:  Goal Directed and Descriptions of Associations: Intact  Orientation:  Full (Time, Place, and Person)  Thought Content: Logical   Suicidal Thoughts:  No  Homicidal Thoughts:  No  Memory:  WNL  Judgement:  Good  Insight:  Good  Psychomotor Activity:  unable to assess  Concentration:  Concentration: Good and Attention Span: Good  Recall:  Good  Fund of Knowledge: Good  Language: Good  Assets:  Desire for Improvement  ADL's:  Intact  Cognition: WNL  Prognosis:  Good    DIAGNOSES:    ICD-10-CM   1. Generalized anxiety disorder  F41.1   2. Attention deficit hyperactivity disorder (ADHD), predominantly inattentive type  F90.0   3. Insomnia, unspecified type  G47.00   4. Restless leg syndrome  G25.81   5. Grief  F43.21   6. 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  [name] knew at the last visit that she would not be able to remain on both the stimulant and a benzodiazepine.  I again discussed the reasoning and the effects on the nervous system to have an upper and a downer at the same time plus the fact that they essentially negate the efficacy of each other.  She began crying and said she did not know that she would have to make this decision this soon.  This is something that has been going on not just since her mom passed away but even before that.    We discussed the different options that are nonstimulant or nonbenzo treatments for both anxiety and ADHD.  She feels that the ADHD needs to be treated the most.  So we will continue the Adderall.  I agreed to very gradually wean her off of the Xanax and she verbalizes understanding on how to do so.  We will  start BuSpar which we will help prevent, and treat the anxiety. Wean off Xanax 0.5 mg, total of 2.5 pills divided throughout the day for 2 weeks, then 2 pills divided throughout the day for 2 weeks, then 1.5 pills divided daily for 2 weeks, then 1 daily for 2 weeks, then one half p.o. daily for 2 weeks and then stop. Start BuSpar 15 mg 1/3 tablet twice daily for 1 week, then increase to 2/3 tablet twice daily for 1 week, then increase to 1 tablet twice daily for anxiety.  Benefits, risks, side effects were discussed and she accepts. Continue Adderall XR 15 mg 1 twice daily as needed. Continue Pristiq 50 mg daily. Continue trazodone 100 mg, 1-2 nightly as needed sleep. Continue Chantix continuing month pack as directed. Continue psychotherapy with Para [facility] Return in 4 weeks.  Donnal Moat, PA-C

## 2019-06-20 ENCOUNTER — Telehealth: Payer: Self-pay | Admitting: Physician Assistant

## 2019-07-08 ENCOUNTER — Other Ambulatory Visit: Payer: Self-pay | Admitting: Physician Assistant

## 2019-07-14 ENCOUNTER — Telehealth: Payer: Self-pay | Admitting: Physician Assistant

## 2019-07-14 NOTE — Telephone Encounter (Signed)
Pt needs refill on Adderall and Alprazolam. Please send to [facility] in [place].

## 2019-07-15 ENCOUNTER — Other Ambulatory Visit: Payer: Self-pay | Admitting: Psychiatry

## 2019-07-15 ENCOUNTER — Other Ambulatory Visit: Payer: Self-pay | Admitting: Physician Assistant

## 2019-07-15 ENCOUNTER — Telehealth: Payer: Self-pay | Admitting: Physician Assistant

## 2019-07-15 ENCOUNTER — Other Ambulatory Visit: Payer: Self-pay

## 2019-07-15 MED ORDER — ALPRAZOLAM 0.5 MG PO TABS: 0.5000 mg | ORAL_TABLET | Freq: Three times a day (TID) | ORAL | 0 refills | Status: DC | PRN

## 2019-07-15 MED ORDER — AMPHETAMINE-DEXTROAMPHET ER 15 MG PO CP24: 15.0000 mg | ORAL_CAPSULE | Freq: Two times a day (BID) | ORAL | 0 refills | Status: DC | PRN

## 2019-07-15 NOTE — Telephone Encounter (Signed)
Increase the Buspar 15 mg to tid.  Start Gabapentin 300 mg qhs for 2 nights, then increase to bid.  If suicidal, go to ER immed. Can you please send in the Rx?  Thanks.

## 2019-07-15 NOTE — Telephone Encounter (Signed)
Return telephone call and explained office policy and good medical procedure that stimulants and sedatives are not generally prescribed together and that is the reason that Donnal Moat did set up a schedule of tapering the Xanax because the patient chose to continue the stimulant and taper the Xanax.  She is now changed her mind and wants to continue the Xanax and stop the Adderall because her anxiety is not well managed.  Checked PDMP.  She last picked up the Adderall over 30 days ago so she would discontinue it.  I have informed [facility] and not to refill further prescription of Adderall XR.  She can return to Xanax 0.5 mg 3 times daily as needed anxiety.  Sent in prescription to [facility]  She is also concerned that perhaps the buspirone is giving her headaches.  Told her she could stop it abruptly and if it is causing headaches within 48 hours the headache should resolve.  Suggested she check her blood pressure  Continue Pristiq without change and 50 mg daily  She feels like the trazodone is calls severe adverse side effects so instructed her not to take the trazodone any longer.

## 2019-07-15 NOTE — Telephone Encounter (Signed)
Patient called and said that she is having panic atttacks daily and her anxiety is very high. She is lost. She had a reaction after taking trazadone and the prestiq. Thought she had parkinsons because she had muscle tremors and slurred speech stuttering.  She is in a very dark place. She has not been able to talk to grief counselor since he had surgery.  Please call her with options. She doesn't feel like doing anything, not eating or sleeping. Please call her at [phone]

## 2019-07-15 NOTE — Telephone Encounter (Signed)
Pended for approval has apt 08/19/2019

## 2019-07-15 NOTE — Progress Notes (Signed)
Return telephone call and explained office policy and good medical procedure that stimulants and sedatives are not generally prescribed together and that is the reason that Donnal Moat did set up a schedule of tapering the Xanax because the patient chose to continue the stimulant and taper the Xanax.  She is now changed her mind and wants to continue the Xanax and stop the Adderall because her anxiety is not well managed.  She last picked up the Adderall over 30 days ago so she would discontinue it.  I have informed [facility] and not to refill further prescription of Adderall XR.  She can return to Xanax 0.5 mg 3 times daily as needed anxiety.  She is also concerned that perhaps the buspirone is giving her headaches.  Told her she could stop it abruptly and if it is causing headaches within 48 hours the headache should resolve.  Suggested she check her blood pressure  Continue Pristiq without change and 50 mg daily  She feels like the trazodone is calls severe adverse side effects so instructed her not to take the trazodone any longer.

## 2019-07-15 NOTE — Telephone Encounter (Signed)
Also, [facility] in  [place] so please cancel script at [employer]

## 2019-07-15 NOTE — Telephone Encounter (Signed)
RTC LM Lynder Parents, MD, DFAPA

## 2019-07-15 NOTE — Telephone Encounter (Signed)
RTC  Asked what her concerns were. CC anxiety.  Upset over Xanax refill.  Explained   Trazodone adverse effects. Don't take them.  Now saying she would rather take Xanax than Adderall and changed her mind about it.  Asks for full Xanax dose instead of Adderall.

## 2019-07-16 ENCOUNTER — Telehealth: Payer: Self-pay | Admitting: Psychiatry

## 2019-07-16 DIAGNOSIS — F411 Generalized anxiety disorder: Secondary | ICD-10-CM

## 2019-07-16 MED ORDER — ALPRAZOLAM 0.5 MG PO TABS: 0.5000 mg | ORAL_TABLET | Freq: Three times a day (TID) | ORAL | 0 refills | Status: AC | PRN

## 2019-07-16 NOTE — Telephone Encounter (Signed)
Patient phones that Xanax eScription of Dr. Clovis Pu did not arrive at [facility] last night as she discovered when she went there this morning for pickup.  Epic documents that the E scription was no print for apparently) not normal function for E scription so that I registered that the note to the pharmacy and resent it by normal function this morning after assuring the patient by her phone call that I would do so.  Receipt is confirmed by pharmacy in epic.

## 2019-07-18 NOTE — Telephone Encounter (Signed)
Thank you both for taking care of her.

## 2019-08-08 ENCOUNTER — Other Ambulatory Visit: Payer: Self-pay | Admitting: Physician Assistant

## 2019-08-08 ENCOUNTER — Telehealth: Payer: Self-pay | Admitting: Physician Assistant

## 2019-08-08 DIAGNOSIS — F411 Generalized anxiety disorder: Secondary | ICD-10-CM

## 2019-08-08 MED ORDER — ALPRAZOLAM 0.5 MG PO TABS: 0.5000 mg | ORAL_TABLET | Freq: Three times a day (TID) | ORAL | 1 refills | Status: AC | PRN

## 2019-08-08 NOTE — Telephone Encounter (Signed)
Nevermind Beth, I didn't remember protocol.  I'll dictate a letter.

## 2019-08-08 NOTE — Telephone Encounter (Signed)
I'd like you to see the above note as well.  Thanks.

## 2019-08-08 NOTE — Telephone Encounter (Signed)
I spoke with both pharmacies and there is no Adderall at either pharmacy she can fill.

## 2019-08-08 NOTE — Telephone Encounter (Signed)
There are certainly grounds to dismiss her from the practice.  She was clearly given instruction by both Donnal Moat and myself that it was not appropriate to continue the use of Adderall and Xanax simultaneously.  Despite being given this information at least twice she violated provider instructions about the use of controlled substances.  There is always clinical judgment used in termination and Donnal Moat is free to choose not to do termination if she feels that is most clinically appropriate.  However if termination is pursued then there needs to be written letter of termination indicating that we will provide emergency services as well as appropriate medication refills for the next 8 weeks following the date of the letter to allow for her to find another provider outside of this practice.

## 2019-08-08 NOTE — Telephone Encounter (Signed)
Thanks [employer].

## 2019-08-08 NOTE — Telephone Encounter (Signed)
Pt called to request refill on Alprazolam @ [facility] on file [phone]. Next appt 1/22

## 2019-08-08 NOTE — Telephone Encounter (Signed)
Dismissal letter was written.

## 2019-08-08 NOTE — Telephone Encounter (Signed)
[name], please call [facility] and [facility] in [place], and see if they have any further Adderall prescriptions on file.  If they do, cancel them.  They had already been canceled at [facility] is my understanding, but I was not aware that she had an old one on file at [facility].  So please check on that and cancel it. For the record, I just called the patient after reviewing the PDMP.  On 07/15/2019, she spoke with Dr. Clovis Pu and they agreed that she would stop the Adderall because she needed the Xanax more at the time.  She agreed not to fill any Adderall prescriptions and the ones at [facility] in [place] were canceled.  On the registry, it shows that the patient filled a prescription for Adderall on 07/18/2019 at [facility].  When I called her just now she was working and stated she had a customer on the phone and she could not talk right now.  I told her that we could discuss this later and she abruptly stated that she was only given #60 pills of the Xanax not #90.  I started to tell her what I had discovered about the Adderall and she stopped me and said "yeah yes I did.  I filled it."  She said that she needed to get off the phone.Before we hung up, I told her I was not refilling the Xanax b/c she had broken the verbal agreement that she and Dr. Clovis Pu had made. She said ok, ok.  And hung up.

## 2019-08-08 NOTE — Telephone Encounter (Signed)
Please proceed w/ dismissal letter.  Thanks.

## 2019-08-08 NOTE — Telephone Encounter (Signed)
Thank you.  I will dismiss her.

## 2019-08-09 ENCOUNTER — Telehealth: Payer: Self-pay | Admitting: Physician Assistant

## 2019-08-09 NOTE — Telephone Encounter (Signed)
Noted, left patient voicemail that she can pick up her Xanax Rx at [facility]

## 2019-08-09 NOTE — Telephone Encounter (Signed)
Patient can pick up the alprazolam prescription.  We will not prescribe any more stimulant.  This is per previous conversations with the patient from both myself and Helene Kelp.  Because the patient picked up an Adderall prescription AMA she is going to be terminated from the practice and will be receiving a letter outlining details.  We will prescribe refills on her other current medications for the next 8 weeks until she can find a new provider.  It is not necessary that you provide the patient all the details since she will be seeing receiving the details in writing but the bottom line is she can pick up the alprazolam prescription because were concerned about her having withdrawal symptoms if she abruptly stops it.

## 2019-08-09 NOTE — Telephone Encounter (Signed)
[name] called today saying [facility] called her to say her Xanax RX is ready. She is not sure if she should take it due to recent conversations. Is this an error and should she pick up the Xanax.  Pt is at work- can leave a message with her.

## 2019-08-19 ENCOUNTER — Ambulatory Visit: Payer: No Typology Code available for payment source | Admitting: Physician Assistant

## 2019-08-26 ENCOUNTER — Telehealth: Payer: Self-pay | Admitting: Physician Assistant

## 2019-08-26 NOTE — Telephone Encounter (Signed)
Patient called and said that she would like to wean off of the prestiq and she would like that sent to the [facility] in [place]. Please include the instructions for her to wean off this medicine

## 2019-08-26 NOTE — Telephone Encounter (Signed)
I'm not making any medication changes.  She can discuss w/ new provider.

## 2019-08-29 ENCOUNTER — Other Ambulatory Visit: Payer: Self-pay

## 2019-08-29 MED ORDER — DESVENLAFAXINE SUCCINATE ER 25 MG PO TB24: 25.0000 mg | ORAL_TABLET | Freq: Every day | ORAL | 0 refills | Status: DC

## 2019-08-29 NOTE — Telephone Encounter (Signed)
Rtc to patient and left message to call back to discuss a couple of options, to do a refill of Pristiq 50 mg, she can get this at [facility] with [facility] for $19.90 or [facility] for $25.70. If not wanting to do that we can send a Rx for Pristiq 25 mg, take 1 daily for one month then she can discontinue.

## 2019-08-29 NOTE — Telephone Encounter (Signed)
Marcha called wanting to know why her pristiq had not been filled.  She said she hasn't found a new provider yet.  It has only been a week since she was dismissed from here.  That's not enough time.  She is out of the pristiq and needs just enough to wean off.  Please send in a presciption to allow her to do this and let her know the instructions on how to wean off.  This is all she is asking.  Send to [facility] in [place], not [facility] as indicated in the last note

## 2019-10-06 ENCOUNTER — Telehealth: Payer: Self-pay | Admitting: Physician Assistant

## 2019-10-06 NOTE — Telephone Encounter (Signed)
Please review this.  I'm not sure of date of dismissal.  If it's been 2 months, I will not Rx more.

## 2019-10-06 NOTE — Telephone Encounter (Signed)
Pt and insurance company called with 2 way call requesting refill for Xanax @ [facility] in [place]. Stated he had received letter from Provider that refill would be given for 2 mouth to give time to find new Provider. Information given was not updated and not able to find new provider. Insurance trying to help with finding provider now.

## 2019-10-07 ENCOUNTER — Other Ambulatory Visit: Payer: Self-pay | Admitting: Physician Assistant

## 2019-10-11 NOTE — Telephone Encounter (Signed)
I just saw this in my in-basket so I'm sorry for the delay. If things are urgent you might want to send them to [facility]. It looks like you wrote the letter Jan 12 and it would of been mailed that week. I think it may be fair to refill.

## 2019-10-11 NOTE — Telephone Encounter (Signed)
She was given a total of 2 month supply from the date of the dismissal letter, as stated.

## 2019-11-22 NOTE — Telephone Encounter (Signed)
error 

## 2019-11-23 ENCOUNTER — Other Ambulatory Visit (HOSPITAL_COMMUNITY): Payer: Self-pay | Admitting: Nurse Practitioner

## 2019-11-23 ENCOUNTER — Other Ambulatory Visit: Payer: Self-pay | Admitting: Nurse Practitioner

## 2019-11-23 DIAGNOSIS — Z87891 Personal history of nicotine dependence: Secondary | ICD-10-CM

## 2019-12-12 ENCOUNTER — Encounter (HOSPITAL_COMMUNITY): Payer: Self-pay

## 2019-12-12 ENCOUNTER — Ambulatory Visit (HOSPITAL_COMMUNITY): Payer: No Typology Code available for payment source

## 2019-12-29 ENCOUNTER — Ambulatory Visit (INDEPENDENT_AMBULATORY_CARE_PROVIDER_SITE_OTHER): Payer: No Typology Code available for payment source | Admitting: Physician Assistant

## 2019-12-29 ENCOUNTER — Encounter: Payer: Self-pay | Admitting: Physician Assistant

## 2019-12-29 ENCOUNTER — Other Ambulatory Visit: Payer: Self-pay

## 2019-12-29 DIAGNOSIS — Z1283 Encounter for screening for malignant neoplasm of skin: Secondary | ICD-10-CM

## 2019-12-29 DIAGNOSIS — Z86018 Personal history of other benign neoplasm: Secondary | ICD-10-CM

## 2019-12-29 DIAGNOSIS — L814 Other melanin hyperpigmentation: Secondary | ICD-10-CM

## 2019-12-29 DIAGNOSIS — Z87898 Personal history of other specified conditions: Secondary | ICD-10-CM

## 2019-12-29 MED ORDER — TRETINOIN 0.025 % EX CREA: TOPICAL_CREAM | Freq: Every evening | CUTANEOUS | 2 refills | Status: AC

## 2019-12-29 NOTE — Progress Notes (Signed)
   New Patient Visit  Subjective  [name] is a 54 y.o. female who presents for the following: Annual Exam (Seen a few years ago and we recomended PDT on chest but patient never got it done. Also check right ear weird spot per patient. Also check patients back had a cyst drained but its coming back may want to schedule surgery. ). Brown freckle right ear been there at least 1 year. We had discussed PDT on the chest in the fall. History of atypical mole on abdomen from 2016. She also has had a cyst on her back that was drained but not excised.   Objective  Well appearing patient in no apparent distress; mood and affect are within normal limits.  A full examination was performed including head, eyes, ears, nose, lips, neck, chest, axillae, abdomen, back, buttocks, bilateral upper extremities, bilateral lower extremities, hands, feet, fingers, toes, fingernails, and toenails. All findings within normal limits unless otherwise noted below. No suspicious moles noted on back.  Objective  Mid lower abdomen: Mild atypia  Objective  Right Inferior Crus of Antihelix: Scattered tan macules.   Images    Assessment & Plan  History of atypical nevus Mid lower abdomen  Lentigo Right Inferior Crus of Antihelix  Watch for changes.  Ordered Medications: tretinoin (RETIN-A) 0.025 % cream  Skin exam for malignant neoplasm Mid Back  Total body skin exam.

## 2019-12-30 ENCOUNTER — Encounter: Payer: Self-pay | Admitting: Physician Assistant

## 2020-05-17 ENCOUNTER — Encounter: Payer: Self-pay | Admitting: Psychiatry

## 2020-12-31 ENCOUNTER — Ambulatory Visit
Admission: RE | Admit: 2020-12-31 | Discharge: 2020-12-31 | Disposition: A | Discharge status: Home / Self Care | Payer: No Typology Code available for payment source | Source: Ambulatory Visit | Attending: Internal Medicine | Admitting: Internal Medicine

## 2020-12-31 ENCOUNTER — Other Ambulatory Visit: Payer: Self-pay

## 2020-12-31 ENCOUNTER — Other Ambulatory Visit: Payer: No Typology Code available for payment source | Admitting: Internal Medicine

## 2020-12-31 ENCOUNTER — Other Ambulatory Visit: Payer: Self-pay | Admitting: Internal Medicine

## 2020-12-31 DIAGNOSIS — Z139 Encounter for screening, unspecified: Secondary | ICD-10-CM

## 2022-05-29 ENCOUNTER — Other Ambulatory Visit: Payer: Self-pay | Admitting: Internal Medicine

## 2022-05-29 DIAGNOSIS — Z1231 Encounter for screening mammogram for malignant neoplasm of breast: Secondary | ICD-10-CM

## 2022-08-07 ENCOUNTER — Encounter: Payer: Self-pay | Admitting: Nutrition

## 2022-08-07 ENCOUNTER — Encounter: Payer: No Typology Code available for payment source | Attending: Family Medicine | Admitting: Nutrition

## 2022-08-07 VITALS — Ht 67.5 in | Wt 179.0 lb

## 2022-08-07 DIAGNOSIS — R638 Other symptoms and signs concerning food and fluid intake: Secondary | ICD-10-CM | POA: Diagnosis not present

## 2022-08-07 NOTE — Progress Notes (Signed)
Medical Nutrition Therapy  Appointment Start time:  0800  Appointment End time:  0900  Primary concerns today: Unhealthy eating, weight gain  Referral diagnosis: E63.8 Preferred learning style: No preference Learning readiness: Ready    NUTRITION ASSESSMENT  57 yr old wfemale wanting help with improving her overall nutrition, lose weight and feel better. She notes she has to work 3 jobs.  She notes she has a lot of stress and anxiety. Feels like she is depressed due to her current situation, but not suicidal.  Wants to start seeing a therapist to help her with all her emotional  needs at this time. She is on Adderall, and Xanax.   She notes her eating habits are not good and she doesn't eat some days. Skips meals due to busy work schedule and then doesn't have energy to fix healthier foods. Food is mostly fast food or pick up meals.  Her husband was in car accident and not able to work. Admits to stress eating, not eating meals on time, skipping meals and sometimes not eating for a day or 2 due to stress and busy schedule.   Doesn't get good sleep. Due to work schedule, she notes she eats late at night and then is so exhausted, she goes straight to bed.  Is a smoker and currently working on quitting. Unable to incorporate exercise currently but wants to soon.  She is willing to apply the principals of Lifestyle Medicine to help improve her overall health and work on losing some undesired weight.  Current eating habits are inconsistent to meet her needs and are contributing to her weight gain, fatigue and overall poor health.  Anthropometrics  Wt Readings from Last 3 Encounters:  08/07/22 179 lb (81.2 kg)  11/09/15 157 lb (71.2 kg)  10/09/15 164 lb 2 oz (74.4 kg)   Ht Readings from Last 3 Encounters:  08/07/22 5' 7.5" (1.715 m)  11/09/15 '5\' 7"'$  (1.702 m)  10/09/15 '5\' 8"'$  (1.727 m)   Body mass index is 27.62 kg/m. '@BMIFA'$ @ Facility age limit for growth %iles is 20 years. Facility  age limit for growth %iles is 20 years.    Clinical Medical Hx: Crohns disease, ADD, GAD Medications: See chart Labs:  None Notable Signs/Symptoms: Fatigue, Stress, poor sleep, no energy,   Lifestyle & Dietary Hx Married and cares for her disabled husband from a car accident.  Works 3 jobs. Doesn't have time to cook Has anxiety and some depression  Estimated daily fluid intake: 40-mostly sodas/caffeine coffee oz Supplements:  Sleep: poor Stress / self-care: a lot- physical and emotionally Current average weekly physical activity: ADL  24-Hr Dietary Recall Eats 1-2 meals per day; skips meals some days. Eats late at night and goes to bed often due to work schedule. Eats more fast food or pick up meals due to busy schedule.  Estimated Energy Needs Calories: 1500 Carbohydrate: 170g Protein: 112g Fat: 42g   NUTRITION DIAGNOSIS  NI-1.7 Predicted excessive energy intake As related to highly processed diet.  As evidenced by BMI 27 and 10 lbs weight gain. Marland Kitchen   NUTRITION INTERVENTION  Nutrition education (E-1) on the following topics:  Lifestyle Medicine  - Whole Food, Plant Predominant Nutrition is highly recommended: Eat Plenty of vegetables, Mushrooms, fruits, Legumes, Whole Grains, Nuts, seeds in lieu of processed meats, processed snacks/pastries red meat, poultry, eggs.    -It is better to avoid simple carbohydrates including: Cakes, Sweet Desserts, Ice Cream, Soda (diet and regular), Sweet Tea, Candies, Chips, Cookies, Store  Bought Juices, Alcohol in Excess of  1-2 drinks a day, Lemonade,  Artificial Sweeteners, Doughnuts, Coffee Creamers, "Sugar-free" Products, etc, etc.  This is not a complete list.....  Exercise: If you are able: 30 -60 minutes a day ,4 days a week, or 150 minutes a week.  The longer the better.  Combine stretch, strength, and aerobic activities.  If you were told in the past that you have high risk for cardiovascular diseases, you may seek evaluation by your  heart doctor prior to initiating moderate to intense exercise programs.   Handouts Provided Include  Lifestyle Medicine  Learning Style & Readiness for Change Teaching method utilized: Visual & Auditory  Demonstrated degree of understanding via: Teach Back  Barriers to learning/adherence to lifestyle change: working 3 jobs, emotional and physical stress  Goals Established by Pt Goals  Goals  Increase water to 3 bottles per day Eat 3 meals per day Choose more whole food plant based items Eat meals on schedule Cut down on fast foods and processed foods. Cut out sodas. Watch "We are what we eat" documentary on Netflix and [place].  MONITORING & EVALUATION Dietary intake, weekly physical activity, and weight in 1 month.  Next Steps  Patient is to work on meal planning or getting husband to help prepare healthier foods.

## 2022-08-07 NOTE — Patient Instructions (Addendum)
Goals  Increase water to 3 bottles per day Eat 3 meals per day Choose more whole food plant based items Eat meals on schedule Cut down on fast foods and processed foods. Cut out sodas. Watch "We are what we eat" documentary on Netflix and [place].

## 2022-08-11 ENCOUNTER — Encounter: Payer: Self-pay | Admitting: Nutrition

## 2022-09-18 ENCOUNTER — Ambulatory Visit: Payer: No Typology Code available for payment source | Admitting: Nutrition

## 2022-11-25 DIAGNOSIS — J3489 Other specified disorders of nose and nasal sinuses: Secondary | ICD-10-CM | POA: Insufficient documentation

## 2022-11-25 DIAGNOSIS — H9012 Conductive hearing loss, unilateral, left ear, with unrestricted hearing on the contralateral side: Secondary | ICD-10-CM | POA: Insufficient documentation

## 2022-11-25 DIAGNOSIS — R439 Unspecified disturbances of smell and taste: Secondary | ICD-10-CM | POA: Insufficient documentation

## 2022-11-25 DIAGNOSIS — H9193 Unspecified hearing loss, bilateral: Secondary | ICD-10-CM | POA: Insufficient documentation

## 2023-05-18 ENCOUNTER — Ambulatory Visit
Admission: RE | Admit: 2023-05-18 | Discharge: 2023-05-18 | Disposition: A | Discharge status: Home / Self Care | Payer: No Typology Code available for payment source | Source: Ambulatory Visit | Attending: Family Medicine | Admitting: Family Medicine

## 2023-05-18 DIAGNOSIS — Z1231 Encounter for screening mammogram for malignant neoplasm of breast: Secondary | ICD-10-CM

## 2023-08-19 ENCOUNTER — Ambulatory Visit: Payer: Self-pay | Admitting: Allergy & Immunology

## 2023-09-04 ENCOUNTER — Ambulatory Visit (INDEPENDENT_AMBULATORY_CARE_PROVIDER_SITE_OTHER): Payer: No Typology Code available for payment source | Admitting: Allergy & Immunology

## 2023-09-04 ENCOUNTER — Encounter: Payer: Self-pay | Admitting: Allergy & Immunology

## 2023-09-04 ENCOUNTER — Other Ambulatory Visit: Payer: Self-pay

## 2023-09-04 VITALS — BP 116/60 | HR 102 | Temp 98.4°F | Resp 18 | Ht 66.54 in | Wt 191.0 lb

## 2023-09-04 DIAGNOSIS — R1013 Epigastric pain: Secondary | ICD-10-CM | POA: Insufficient documentation

## 2023-09-04 DIAGNOSIS — R21 Rash and other nonspecific skin eruption: Secondary | ICD-10-CM | POA: Diagnosis not present

## 2023-09-04 DIAGNOSIS — Z1211 Encounter for screening for malignant neoplasm of colon: Secondary | ICD-10-CM | POA: Insufficient documentation

## 2023-09-04 DIAGNOSIS — R197 Diarrhea, unspecified: Secondary | ICD-10-CM | POA: Insufficient documentation

## 2023-09-04 DIAGNOSIS — R141 Gas pain: Secondary | ICD-10-CM | POA: Insufficient documentation

## 2023-09-04 DIAGNOSIS — J3489 Other specified disorders of nose and nasal sinuses: Secondary | ICD-10-CM | POA: Diagnosis not present

## 2023-09-04 DIAGNOSIS — I700 Atherosclerosis of aorta: Secondary | ICD-10-CM | POA: Insufficient documentation

## 2023-09-04 DIAGNOSIS — L03311 Cellulitis of abdominal wall: Secondary | ICD-10-CM | POA: Insufficient documentation

## 2023-09-04 DIAGNOSIS — N83209 Unspecified ovarian cyst, unspecified side: Secondary | ICD-10-CM | POA: Insufficient documentation

## 2023-09-04 MED ORDER — TACROLIMUS 0.1 % EX OINT: TOPICAL_OINTMENT | Freq: Two times a day (BID) | CUTANEOUS | 1 refills | Status: DC

## 2023-09-04 NOTE — Patient Instructions (Addendum)
 1. Rash - You have a number of different rashes, which is weird.  - Please email pictures to [email or web address] so that we can put them into your chart.  - I am going to get some labs to look for weird causes of itching (although you have had a huge workup in the past). - I am going to get a new ANA and inflammatory markers.  - We might consider starting an injectable medication called Dupixent or Nemluvio to treat the rash.   2. Rhinorrhea - We will schedule you for allergy  testing. - Because of insurance stipulations, we cannot do skin testing on the same day as your first visit. - We are all working to fight this, but for now we need to do two separate visits.  - We will know more after we do testing at the next visit.  - The skin testing visit can be squeezed in at your convenience.  - Then we can make a more full plan to address all of your symptoms. - Be sure to stop your antihistamines for 3 days before this appointment.   3. Return in about 1 week (around 09/11/2023). You can have the follow up appointment with Dr. Iva or a Nurse Practicioner (our Nurse Practitioners are excellent and always have Physician oversight!).    Please inform us  of any Emergency Department visits, hospitalizations, or changes in symptoms. Call us  before going to the ED for breathing or allergy  symptoms since we might be able to fit you in for a sick visit. Feel free to contact us  anytime with any questions, problems, or concerns.  It was a pleasure to meet you today!  Websites that have reliable patient information: 1. American Academy of Asthma, Allergy , and Immunology: www.aaaai.org 2. Food Allergy  Research and Education (FARE): foodallergy.org 3. Mothers of Asthmatics: http://www.asthmacommunitynetwork.org 4. American College of Allergy , Asthma, and Immunology: www.acaai.org      "Like" us  on Facebook and Instagram for our latest updates!      A healthy democracy works best  when [employer] participate! Make sure you are registered to vote! If you have moved or changed any of your contact information, you will need to get this updated before voting! Scan the QR codes below to learn more!

## 2023-09-04 NOTE — Progress Notes (Signed)
 NEW PATIENT  Date of Service/Encounter:  09/04/23  Consult requested by: Patient, No Pcp Per   Assessment:   Rash  Rhinorrhea - will plan for skin testing at the next visit  Plan/Recommendations:   1. Rash - You have a number of different rashes, which is weird.  - Please email pictures to [email or web address] so that we can put them into your chart.  - I am going to get some labs to look for weird causes of itching (although you have had a huge workup in the past). - I am going to get a new ANA and inflammatory markers.  - We might consider starting an injectable medication called Dupixent or Nemluvio to treat the rash.   2. Rhinorrhea - We will schedule you for allergy  testing. - Because of insurance stipulations, we cannot do skin testing on the same day as your first visit. - We are all working to fight this, but for now we need to do two separate visits.  - We will know more after we do testing at the next visit.  - The skin testing visit can be squeezed in at your convenience.  - Then we can make a more full plan to address all of your symptoms. - Be sure to stop your antihistamines for 3 days before this appointment.   3. Return in about 1 week (around 09/11/2023). You can have the follow up appointment with Dr. Iva or a Nurse Practicioner (our Nurse Practitioners are excellent and always have Physician oversight!).   This note in its entirety was forwarded to the Provider who requested this consultation.  Subjective:   [name] is a 58 y.o. female presenting today for evaluation of  Chief Complaint  Patient presents with   Rash    Started June of 2024 - missed 5 week of work not sure the cause ( rash and bumps on mouth/ tongue)  / September last year was most recent tick bite    Other    Saw Derm October of 2024 and recommended being seen by an allergist.      [name] has a history of the following: Patient Active Problem  List   Diagnosis Date Noted   Cellulitis of abdominal wall 09/04/2023   Colon cancer screening 09/04/2023   Cyst of ovary 09/04/2023   Diarrhea 09/04/2023   Epigastric pain 09/04/2023   Flatulence, eructation and gas pain 09/04/2023   Hardening of the aorta (main artery of the heart) (HCC) 09/04/2023   Bilateral hearing loss 11/25/2022   Condctv hear loss, uni, left ear, w unrestr hear cntra side 11/25/2022   GAD (generalized anxiety disorder) 08/25/2018   Insomnia 08/25/2018   Attention deficit hyperactivity disorder (ADHD) 08/25/2018   Crohn's colitis (HCC) 08/06/2018    History obtained from: chart review and patient.  Discussed the use of AI scribe software for clinical note transcription with the patient and/or guardian, who gave verbal consent to proceed.  [name] was referred by Patient, No Pcp Per.     [name] is a 58 y.o. female presenting for an evaluation of a rash and environmental allergies .  [name] is a 59 year old female with Crohn's disease who presents with recurrent rashes and tongue lesions. She was accompanied by her husband.  Since January 14, 2023, she has experienced recurrent rashes, initially appearing on the back of her neck and described by her husband as resembling a burn from an iron. The rash did  not itch initially but has recurred multiple times, with a significant rash on her arm that was swabbed by a dermatologist due to fluid accumulation. The rash has worsened, causing her to miss six weeks of work. It starts with a red spot that evolves into a pox-like, weeping rash that is intensely itchy. Various treatments have been tried without relief.  She also experiences painful lesions on her tongue, described as 'little white bumps' that become large and painful, leading to avoidance of eating for days. A mouthwash prescribed by a dentist was ineffective. She associates the onset of tongue issues with the use of Chantix , which she took to quit  smoking. She has seen dentist for this who was not very impressed.   She has a history of Crohn's disease and suspects a possible milk-related trigger for her symptoms. A rheumatologist noted elevated Epstein-Barr virus antibodies (IgG only) and some autoimmune activity. She has also experienced a loss of taste, which she attributes to her condition.  She has a history of smoking but has recently quit, reporting a runny nose since quitting. She quit around 5 weeks ago. She has tried a number of times in the past without complete cessation.   No new environmental exposures at home, no fevers, and no significant environmental allergies are noted.  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 She has a history of ear surgeries and a stapedectomy, which resulted in hearing issues.     She did have clobetasol. She has been on hydrocortisone and a couple of other prescription ointments. She has never tried Protopic  and Elide.   Otherwise, there is no history of other atopic diseases, including asthma, food allergies, drug allergies, stinging insect allergies, or contact dermatitis. There is no significant infectious history. Vaccinations are up to date.    Past Medical History: Patient Active Problem List   Diagnosis Date Noted   Cellulitis of abdominal wall 09/04/2023   Colon cancer screening 09/04/2023   Cyst of ovary 09/04/2023   Diarrhea 09/04/2023   Epigastric pain 09/04/2023   Flatulence, eructation and gas pain 09/04/2023   Hardening of the aorta (main artery of the heart) (HCC) 09/04/2023   Bilateral hearing loss 11/25/2022   Condctv hear loss, uni, left ear, w unrestr hear cntra side 11/25/2022   GAD (generalized anxiety disorder) 08/25/2018   Insomnia 08/25/2018   Attention deficit hyperactivity disorder (ADHD) 08/25/2018   Crohn's colitis (HCC) 08/06/2018    Medication List:  Allergies as of 09/04/2023       Reactions    Other Other (See Comments)   Sulfa Antibiotics Itching   Sulfa Antibiotics Itching        Medication List        Accurate as of September 04, 2023 12:40 PM. If you have any questions, ask your nurse or doctor.          alendronate 70 MG tablet Commonly known as: FOSAMAX Take 70 mg by mouth once a week.   ALPRAZolam  0.5 MG tablet Commonly known as: XANAX  Take 1 tablet (0.5 mg total) by mouth 3 (three) times daily as needed for anxiety.   ALPRAZolam  0.5 MG tablet Commonly known as: Xanax  Take 1 tablet (0.5 mg total) by mouth 3 (three) times daily as needed for anxiety.   ALPRAZolam  0.25 MG tablet Commonly known as: XANAX  Take 0.25 mg by mouth daily as needed for anxiety.   amitriptyline  50 MG tablet  Commonly known as: ELAVIL  Take 1 tablet (50 mg total) by mouth at bedtime.   amoxicillin-clavulanate 875-125 MG tablet Commonly known as: AUGMENTIN Take 1 tablet by mouth 2 (two) times daily.   amphetamine -dextroamphetamine 15 MG 24 hr capsule Commonly known as: ADDERALL XR Take 15 mg by mouth 2 (two) times daily as needed.   busPIRone  15 MG tablet Commonly known as: BUSPAR  1/3 po bid for 1 week, then 2/3 po bid for 1 week, then 1 po bid.   CALCIUM PO Take 2 tablets by mouth daily.   CHOLINE PO Take 2 tablets by mouth daily.   desvenlafaxine  50 MG 24 hr tablet Commonly known as: PRISTIQ  Take 1 tablet by mouth once daily   desvenlafaxine  25 MG 24 hr tablet Commonly known as: Pristiq  Take 25 mg by mouth daily.   estradiol 1 MG tablet Commonly known as: ESTRACE Take 1 mg by mouth daily.   estradiol 0.5 MG tablet Commonly known as: ESTRACE Take 0.5 mg by mouth daily.   Fish Oil 1000 MG Caps Take 2 capsules by mouth daily.   gabapentin  300 MG capsule Commonly known as: Neurontin  Take 2 capsules (600 mg total) by mouth 3 (three) times daily.   glucosamine-chondroitin 500-400 MG tablet Take 2 tablets by mouth at bedtime. With Tumeric and magnesium    HYDROcodone-acetaminophen  5-325 MG tablet Commonly known as: NORCO/VICODIN Take 1 tablet by mouth every 6 (six) hours as needed.   hydrOXYzine 25 MG capsule Commonly known as: VISTARIL Take 25 mg by mouth 4 (four) times daily as needed.   lidocaine  5 % Commonly known as: Lidoderm  Place 1 patch onto the skin daily. Remove & Discard patch within 12 hours or as directed by MD   methocarbamol  500 MG tablet Commonly known as: Robaxin  Take 2 tablets (1,000 mg total) by mouth 3 (three) times daily as needed for muscle spasms.   ondansetron  4 MG tablet Commonly known as: ZOFRAN  Take 1 tablet (4 mg total) by mouth every 6 (six) hours.   OVER THE COUNTER MEDICATION Take 1 scoop by mouth 2 (two) times daily. monolaurin powder   oxyCODONE  5 MG immediate release tablet Commonly known as: Roxicodone  Take 1 tablet (5 mg total) by mouth every 4 (four) hours as needed for severe pain. Take 1 tab 3/day for 1 week, then 2/day for 1 week, then daily for 1 week, then d/c.   tacrolimus  0.1 % ointment Commonly known as: PROTOPIC  Apply topically 2 (two) times daily. Started by: Marty Morton Shaggy   THIAMINE PO Take 2 tablets by mouth daily.   traZODone  50 MG tablet Commonly known as: DESYREL  Take 100 mg by mouth at bedtime.   traZODone  100 MG tablet Commonly known as: DESYREL  Take 1-2 tablets (100-200 mg total) by mouth at bedtime.   TURMERIC PO Take 2 tablets by mouth daily.   varenicline  1 MG tablet Commonly known as: Chantix  Continuing Month Pak Take 1 tablet (1 mg total) by mouth 2 (two) times daily.   VITAMIN C PO Take 1 tablet by mouth daily.   Vitamin D3 125 MCG (5000 UT) Tabs Take 2 tablets by mouth daily.        Birth History: non-contributory  Developmental History: non-contributory  Past Surgical History: Past Surgical History:  Procedure Laterality Date   ABDOMINAL HYSTERECTOMY  10/23/2005   partial   APPENDECTOMY     BREAST ENHANCEMENT SURGERY  1998    BREAST EXCISIONAL BIOPSY Right    years ago   BUNIONECTOMY  CLOSED REDUCTION NASAL FRACTURE N/A 05/14/2015   Procedure: CLOSED REDUCTION NASAL FRACTURE;  Surgeon: Daniel Moccasin, MD;  Location: [facility];  Service: ENT;  Laterality: N/A;   FINGER SURGERY Right    middle finger - x 2 others besides ORIF   KNEE ARTHROSCOPY     ORIF FINGER FRACTURE Right    middle finger   ROBOTIC ASSISTED BILATERAL SALPINGO OOPHERECTOMY Bilateral 10/17/2015   Procedure: ROBOTIC ASSISTED BILATERAL SALPINGO OOPHORECTOMY;  Surgeon: Rosaline Luna, MD;  Location: [facility];  Service: Gynecology;  Laterality: Bilateral;   ROBOTIC ASSISTED LAPAROSCOPIC LYSIS OF ADHESION N/A 10/17/2015   Procedure: ROBOTIC ASSISTED LAPAROSCOPIC LYSIS OF ADHESION, CAUTERY OF ENDOMETRIOSIS;  Surgeon: Rosaline Luna, MD;  Location: [facility];  Service: Gynecology;  Laterality: N/A;   STAPEDECTOMY Left    TONSILLECTOMY     age 81     Family History: Family History  Problem Relation Age of Onset   Alzheimer's disease Mother    Breast cancer Neg Hx      Social History: Sameeha lives at home with her family. She lives in a house that has wood flooring through the home. There is electric heating and central cooling. There are cats and dogs inside of the home and outside of the home. She also has horses. She does have dust mite coverings on the bedding. There is no tobacco exposure in the home. There is no fume, chemical, or dust exposure in the home. She does have a HEPA filter in the home. There is no fume, chemical, or dust exposure.    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    Review of systems otherwise negative other than that mentioned in the HPI.    Objective:   Blood pressure 116/60, pulse (!) 102, temperature 98.4 F (36.9 C), temperature source Temporal, resp. rate 18, height 5' 6.54 (1.69 m), weight 191 lb (86.6 kg), SpO2 95%. Body mass index is  30.33 kg/m.     Physical Exam Vitals reviewed.  Constitutional:      Appearance: She is well-developed.     Comments: Very friendly.   HENT:     Head: Normocephalic and atraumatic.     Right Ear: Tympanic membrane, ear canal and external ear normal. No drainage, swelling or tenderness. Tympanic membrane is not injected, scarred, erythematous, retracted or bulging.     Left Ear: Tympanic membrane, ear canal and external ear normal. No drainage, swelling or tenderness. Tympanic membrane is not injected, scarred, erythematous, retracted or bulging.     Nose: No nasal deformity, septal deviation, mucosal edema or rhinorrhea.     Right Turbinates: Enlarged, swollen and pale.     Left Turbinates: Enlarged, swollen and pale.     Right Sinus: No maxillary sinus tenderness or frontal sinus tenderness.     Left Sinus: No maxillary sinus tenderness or frontal sinus tenderness.     Mouth/Throat:     Lips: Pink.     Mouth: Mucous membranes are moist. Mucous membranes are not pale and not dry.     Pharynx: Uvula midline.  Eyes:     General: Lids are normal. Allergic shiner present.        Right eye: No discharge.        Left eye: No discharge.     Conjunctiva/sclera: Conjunctivae normal.     Right eye: Right conjunctiva is not injected. No chemosis.    Left eye: Left conjunctiva  is not injected. No chemosis.    Pupils: Pupils are equal, round, and reactive to light.  Cardiovascular:     Rate and Rhythm: Normal rate and regular rhythm.     Heart sounds: Normal heart sounds.  Pulmonary:     Effort: Pulmonary effort is normal. No tachypnea, accessory muscle usage or respiratory distress.     Breath sounds: Normal breath sounds. No wheezing, rhonchi or rales.  Chest:     Chest wall: No tenderness.  Abdominal:     Tenderness: There is no abdominal tenderness. There is no guarding or rebound.  Lymphadenopathy:     Head:     Right side of head: No submandibular, tonsillar or occipital  adenopathy.     Left side of head: No submandibular, tonsillar or occipital adenopathy.     Cervical: No cervical adenopathy.  Skin:    General: Skin is warm.     Capillary Refill: Capillary refill takes less than 2 seconds.     Coloration: Skin is not pale.     Findings: No abrasion, erythema, petechiae or rash. Rash is not papular, urticarial or vesicular.     Comments: No lesions appreciated today. She does have several rashes on her phone and she is going to email those to us  so we can get them into the chart.   Neurological:     Mental Status: She is alert.  Psychiatric:        Behavior: Behavior is cooperative.      Diagnostic studies: labs sent instead         Marty Shaggy, MD [facility] 

## 2023-09-09 ENCOUNTER — Other Ambulatory Visit: Payer: Self-pay | Admitting: Physical Medicine and Rehabilitation

## 2023-09-09 DIAGNOSIS — M5416 Radiculopathy, lumbar region: Secondary | ICD-10-CM

## 2023-09-11 ENCOUNTER — Encounter: Payer: Self-pay | Admitting: Allergy & Immunology

## 2023-09-11 ENCOUNTER — Ambulatory Visit: Payer: No Typology Code available for payment source | Admitting: Allergy & Immunology

## 2023-09-11 DIAGNOSIS — R21 Rash and other nonspecific skin eruption: Secondary | ICD-10-CM

## 2023-09-11 DIAGNOSIS — J310 Chronic rhinitis: Secondary | ICD-10-CM

## 2023-09-11 LAB — TRYPTASE: Tryptase: 6.4 ug/L (ref 2.2–13.2)

## 2023-09-11 LAB — PROTEIN ELECTROPHORESIS, SERUM, WITH REFLEX
A/G Ratio: 1.4 (ref 0.7–1.7)
Albumin ELP: 3.9 g/dL (ref 2.9–4.4)
Alpha 1: 0.2 g/dL (ref 0.0–0.4)
Alpha 2: 0.6 g/dL (ref 0.4–1.0)
Beta: 1 g/dL (ref 0.7–1.3)
Gamma Globulin: 0.9 g/dL (ref 0.4–1.8)
Globulin, Total: 2.8 g/dL (ref 2.2–3.9)
Total Protein: 6.7 g/dL (ref 6.0–8.5)

## 2023-09-11 LAB — THYROID ANTIBODIES: Thyroperoxidase Ab SerPl-aCnc: 15 [IU]/mL (ref 0–34)

## 2023-09-11 LAB — ALPHA-GAL PANEL: IgE (Immunoglobulin E), Serum: 4 [IU]/mL — ABNORMAL LOW (ref 6–495)

## 2023-09-11 LAB — ANTINUCLEAR ANTIBODIES, IFA

## 2023-09-11 NOTE — Patient Instructions (Addendum)
1. Rash - You have a number of different rashes, which is weird.  - Please email pictures to [email or web address] so that we can put them into your chart.  - All of your labs were negative which is good news. - We might consider starting an injectable medication called Dupixent or Nemluvio to treat the rash.  - Let's do patch testing.   2. Rhinorrhea - Testing today showed: NEGATIVE TO THE ENTIRE PANEL - Copy of test results provided.  - Avoidance measures provided. - Continue with: an antihistamine as needed - You can use an extra dose of the antihistamine, if needed, for breakthrough symptoms.  - Consider nasal saline rinses 1-2 times daily to remove allergens from the nasal cavities as well as help with mucous clearance (this is especially helpful to do before the nasal sprays are given)  3. Return in about 2 weeks (around 09/25/2023) for [facility] TESTING.    Please inform us of any Emergency Department visits, hospitalizations, or changes in symptoms. Call us before going to the ED for breathing or allergy symptoms since we might be able to fit you in for a sick visit. Feel free to contact us anytime with any questions, problems, or concerns.  It was a pleasure to meet you today!  Websites that have reliable patient information: 1. American Academy of Asthma, Allergy, and Immunology: www.aaaai.org 2. Food Allergy Research and Education (FARE): foodallergy.org 3. Mothers of Asthmatics: http://www.asthmacommunitynetwork.org 4. American College of Allergy, Asthma, and Immunology: www.acaai.org      "Like" [place] on Facebook and Instagram for our latest updates!      A healthy democracy works best when [employer] participate! Make sure you are registered to vote! If you have moved or changed any of your contact information, you will need to get this updated before voting! Scan the QR codes below to learn more!        Airborne Adult Perc - 09/11/23 0900     Time Antigen  Placed 1610    Allergen Manufacturer Waynette Buttery    Location Back    Number of Test 55    Panel 1 Select    1. Control-Buffer 50% Glycerol Negative    2. Control-Histamine 2+    3. Bahia Negative    4. French Southern Territories Negative    5. Johnson Negative    6. Kentucky Blue Negative    7. Meadow Fescue Negative    8. Perennial Rye Negative    9. Timothy Negative    10. Ragweed Mix Negative    11. Cocklebur Negative    12. Plantain,  English Negative    13. Baccharis Negative    14. Dog Fennel Negative    15. Russian Thistle Negative    16. Lamb's Quarters Negative    17. Sheep Sorrell Negative    18. Rough Pigweed Negative    19. Marsh Elder, Rough Negative    20. Mugwort, Common Negative    21. Box, Elder Negative    22. Cedar, red Negative    23. Sweet Gum Negative    24. Pecan Pollen Negative    25. Pine Mix Negative    26. Walnut, Black Pollen Negative    27. Red Mulberry Negative    28. Ash Mix Negative    29. Birch Mix Negative    30. Beech American Negative    31. Cottonwood, Guinea-Bissau Negative    32. Hickory, White Negative    33. Maple Mix Negative    34. [place], [place]  Mix Negative    35. Sycamore Eastern Negative    36. Alternaria Alternata Negative    37. Cladosporium Herbarum Negative    38. Aspergillus Mix Negative    39. Penicillium Mix Negative    40. Bipolaris Sorokiniana (Helminthosporium) Negative    41. Drechslera Spicifera (Curvularia) Negative    42. Mucor Plumbeus Negative    43. Fusarium Moniliforme Negative    44. Aureobasidium Pullulans (pullulara) Negative    45. Rhizopus Oryzae Negative    46. Botrytis Cinera Negative    47. Epicoccum Nigrum Negative    48. Phoma Betae Negative    49. Dust Mite Mix Negative    50. Cat Hair 10,000 BAU/ml Negative    51.  Dog Epithelia Negative    52. Mixed Feathers Negative    53. Horse Epithelia Negative    54. Cockroach, German Negative    55. Tobacco Leaf Negative             Intradermal - 09/11/23 0940      Time Antigen Placed 0945    Allergen Manufacturer Waynette Buttery    Location Arm    Number of Test 16    Control Negative    Bahia Negative    French Southern Territories Negative    Johnson Negative    7 Grass Negative    Ragweed Mix Negative    Weed Mix Negative    Tree Mix Negative    Mold 1 Negative    Mold 2 Negative    Mold 3 Negative    Mold 4 Negative    Mite Mix Negative    Cat Negative    Dog Negative    Cockroach Negative             Food Adult Perc - 09/11/23 0900     Time Antigen Placed 1610    Allergen Manufacturer Waynette Buttery    Location Back    Number of allergen test 17    1. Peanut Negative    2. Soybean Negative    3. Wheat Negative    4. Sesame Negative    5. Milk, Cow Negative    6. Casein Negative    7. Egg White, Chicken Negative    8. Shellfish Mix Negative    9. Fish Mix Negative    10. Cashew Negative    11. Walnut Food Negative    12. Almond Negative    13. Hazelnut Negative    14. Pecan Food Negative    15. Pistachio Negative    16. [place] Nut Negative    17. Coconut Negative              True Test looks for the following sensitivities:

## 2023-09-11 NOTE — Progress Notes (Signed)
FOLLOW UP  Date of Service/Encounter:  09/11/23   Assessment:   Non-allergic rhinitis  Rash with pruritus - consider biologic addition  Plan/Recommendations:   1. Rash - You have a number of different rashes, which is weird.  - Please email pictures to [email or web address] so that we can put them into your chart.  - All of your labs were negative which is good news. - We might consider starting an injectable medication called Dupixent or Nemluvio to treat the rash.  - Let's do patch testing.   2. Rhinorrhea - Testing today showed: NEGATIVE TO THE ENTIRE PANEL - Copy of test results provided.  - Avoidance measures provided. - Continue with: an antihistamine as needed - You can use an extra dose of the antihistamine, if needed, for breakthrough symptoms.  - Consider nasal saline rinses 1-2 times daily to remove allergens from the nasal cavities as well as help with mucous clearance (this is especially helpful to do before the nasal sprays are given)  3. Return in about 2 weeks (around 09/25/2023) for [facility] TESTING.     Subjective:   [name] is a 58 y.o. female presenting today for follow up of No chief complaint on file.   [name] has a history of the following: Patient Active Problem List   Diagnosis Date Noted   Cellulitis of abdominal wall 09/04/2023   Colon cancer screening 09/04/2023   Cyst of ovary 09/04/2023   Diarrhea 09/04/2023   Epigastric pain 09/04/2023   Flatulence, eructation and gas pain 09/04/2023   Hardening of the aorta (main artery of the heart) (HCC) 09/04/2023   Bilateral hearing loss 11/25/2022   Condctv hear loss, uni, left ear, w unrestr hear cntra side 11/25/2022   GAD (generalized anxiety disorder) 08/25/2018   Insomnia 08/25/2018   Attention deficit hyperactivity disorder (ADHD) 08/25/2018   Crohn's colitis (HCC) 08/06/2018    History obtained from: chart review and patient.  Discussed the use of AI  scribe software for clinical note transcription with the patient and/or guardian, who gave verbal consent to proceed.  [name] is a 58 y.o. female presenting for skin testing. She was last seen on February 7th. We could not do testing because her insurance company does not cover testing on the same day as a New Patient visit. She has been off of all antihistamines 3 days in anticipation of the testing.   Otherwise, there have been no changes to her past medical history, surgical history, family history, or social history.    Review of systems otherwise negative other than that mentioned in the HPI.    Objective:   There were no vitals taken for this visit. There is no height or weight on file to calculate BMI.    Physical exam deferred since this was a skin testing appointment only.   Diagnostic studies  Allergy Studies:     Airborne Adult Perc - 09/11/23 0900     Time Antigen Placed 1610    Allergen Manufacturer Waynette Buttery    Location Back    Number of Test 55    Panel 1 Select    1. Control-Buffer 50% Glycerol Negative    2. Control-Histamine 2+    3. Bahia Negative    4. French Southern Territories Negative    5. Johnson Negative    6. Kentucky Blue Negative    7. Meadow Fescue Negative    8. Perennial Rye Negative    9. Timothy Negative    10. Ragweed Mix  Negative    11. Cocklebur Negative    12. Plantain,  English Negative    13. Baccharis Negative    14. Dog Fennel Negative    15. Russian Thistle Negative    16. Lamb's Quarters Negative    17. Sheep Sorrell Negative    18. Rough Pigweed Negative    19. Marsh Elder, Rough Negative    20. Mugwort, Common Negative    21. Box, Elder Negative    22. Cedar, red Negative    23. Sweet Gum Negative    24. Pecan Pollen Negative    25. Pine Mix Negative    26. Walnut, Black Pollen Negative    27. Red Mulberry Negative    28. Ash Mix Negative    29. Birch Mix Negative    30. Beech American Negative    31. Cottonwood, Guinea[place]iss[place] Negative     32. Hickory, White Negative    33. Maple Mix Negative    34. Oak, [place] Mix Negative    35. Sycamore Eastern Negative    36. Alternaria Alternata Negative    37. Cladosporium Herbarum Negative    38. Aspergillus Mix Negative    39. Penicillium Mix Negative    40. Bipolaris Sorokiniana (Helminthosporium) Negative    41. Drechslera Spicifera (Curvularia) Negative    42. Mucor Plumbeus Negative    43. Fusarium Moniliforme Negative    44. Aureobasidium Pullulans (pullulara) Negative    45. Rhizopus Oryzae Negative    46. Botrytis Cinera Negative    47. Epicoccum Nigrum Negative    48. Phoma Betae Negative    49. Dust Mite Mix Negative    50. Cat Hair 10,000 BAU/ml Negative    51.  Dog Epithelia Negative    52. Mixed Feathers Negative    53. Horse Epithelia Negative    54. Cockroach, German Negative    55. Tobacco Leaf Negative             Intradermal - 09/11/23 0940     Time Antigen Placed 0945    Allergen Manufacturer Waynette Buttery    Location Arm    Number of Test 16    Control Negative    Bahia Negative    French Southern Territories Negative    Johnson Negative    7 Grass Negative    Ragweed Mix Negative    Weed Mix Negative    Tree Mix Negative    Mold 1 Negative    Mold 2 Negative    Mold 3 Negative    Mold 4 Negative    Mite Mix Negative    Cat Negative    Dog Negative    Cockroach Negative             Food Adult Perc - 09/11/23 0900     Time Antigen Placed 6962    Allergen Manufacturer Waynette Buttery    Location Back    Number of allergen test 17    1. Peanut Negative    2. Soybean Negative    3. Wheat Negative    4. Sesame Negative    5. Milk, Cow Negative    6. Casein Negative    7. Egg White, Chicken Negative    8. Shellfish Mix Negative    9. Fish Mix Negative    10. Cashew Negative    11. Walnut Food Negative    12. Almond Negative    13. Hazelnut Negative    14. Pecan Food Negative    15. Pistachio Negative  16. [place] Nut Negative    17. Coconut Negative              Allergy testing results were read and interpreted by myself, documented by clinical staff.      Carla Bonds, MD  [facility]

## 2023-09-22 ENCOUNTER — Encounter: Payer: Self-pay | Admitting: Physical Medicine and Rehabilitation

## 2023-09-30 ENCOUNTER — Inpatient Hospital Stay: Admission: RE | Admit: 2023-09-30 | Payer: No Typology Code available for payment source | Source: Ambulatory Visit

## 2024-03-07 ENCOUNTER — Other Ambulatory Visit (HOSPITAL_COMMUNITY): Payer: Self-pay | Admitting: Internal Medicine

## 2024-03-07 DIAGNOSIS — T8549XA Other mechanical complication of breast prosthesis and implant, initial encounter: Secondary | ICD-10-CM

## 2024-03-09 ENCOUNTER — Encounter (HOSPITAL_COMMUNITY): Payer: Self-pay | Admitting: Internal Medicine

## 2024-03-10 ENCOUNTER — Ambulatory Visit (HOSPITAL_COMMUNITY)
Admission: RE | Admit: 2024-03-10 | Discharge: 2024-03-10 | Disposition: A | Discharge status: Home / Self Care | Source: Ambulatory Visit | Attending: Internal Medicine | Admitting: Internal Medicine

## 2024-03-10 ENCOUNTER — Other Ambulatory Visit (HOSPITAL_COMMUNITY): Payer: Self-pay | Admitting: Internal Medicine

## 2024-03-10 DIAGNOSIS — T8549XA Other mechanical complication of breast prosthesis and implant, initial encounter: Secondary | ICD-10-CM

## 2024-03-10 DIAGNOSIS — R921 Mammographic calcification found on diagnostic imaging of breast: Secondary | ICD-10-CM

## 2024-03-14 ENCOUNTER — Ambulatory Visit
Admission: RE | Admit: 2024-03-14 | Discharge: 2024-03-14 | Disposition: A | Discharge status: Home / Self Care | Source: Ambulatory Visit | Attending: Internal Medicine | Admitting: Internal Medicine

## 2024-03-14 ENCOUNTER — Other Ambulatory Visit (HOSPITAL_COMMUNITY): Payer: Self-pay | Admitting: Internal Medicine

## 2024-03-14 ENCOUNTER — Inpatient Hospital Stay
Admission: RE | Admit: 2024-03-14 | Discharge: 2024-03-14 | Discharge status: Home / Self Care | Source: Ambulatory Visit | Attending: Internal Medicine | Admitting: Internal Medicine

## 2024-03-14 DIAGNOSIS — R921 Mammographic calcification found on diagnostic imaging of breast: Secondary | ICD-10-CM

## 2024-03-14 HISTORY — PX: BREAST BIOPSY: SHX20

## 2024-03-15 LAB — SURGICAL PATHOLOGY

## 2024-03-22 ENCOUNTER — Encounter (HOSPITAL_COMMUNITY)

## 2024-03-22 ENCOUNTER — Ambulatory Visit (HOSPITAL_COMMUNITY)

## 2024-04-08 ENCOUNTER — Encounter: Payer: Self-pay | Admitting: *Deleted

## 2024-06-27 ENCOUNTER — Ambulatory Visit: Admitting: Nurse Practitioner

## 2024-07-11 ENCOUNTER — Ambulatory Visit: Admitting: Nurse Practitioner

## 2024-08-10 ENCOUNTER — Ambulatory Visit (INDEPENDENT_AMBULATORY_CARE_PROVIDER_SITE_OTHER): Payer: Self-pay | Admitting: Nurse Practitioner

## 2024-08-10 ENCOUNTER — Encounter: Payer: Self-pay | Admitting: Nurse Practitioner

## 2024-08-10 VITALS — BP 110/65 | HR 77 | Temp 98.0°F | Ht 66.0 in | Wt 173.0 lb

## 2024-08-10 DIAGNOSIS — K219 Gastro-esophageal reflux disease without esophagitis: Secondary | ICD-10-CM | POA: Insufficient documentation

## 2024-08-10 DIAGNOSIS — Z0001 Encounter for general adult medical examination with abnormal findings: Secondary | ICD-10-CM | POA: Insufficient documentation

## 2024-08-10 DIAGNOSIS — F411 Generalized anxiety disorder: Secondary | ICD-10-CM | POA: Diagnosis not present

## 2024-08-10 DIAGNOSIS — F1320 Sedative, hypnotic or anxiolytic dependence, uncomplicated: Secondary | ICD-10-CM | POA: Diagnosis not present

## 2024-08-10 DIAGNOSIS — Z1211 Encounter for screening for malignant neoplasm of colon: Secondary | ICD-10-CM | POA: Diagnosis not present

## 2024-08-10 DIAGNOSIS — E559 Vitamin D deficiency, unspecified: Secondary | ICD-10-CM | POA: Insufficient documentation

## 2024-08-10 DIAGNOSIS — J069 Acute upper respiratory infection, unspecified: Secondary | ICD-10-CM | POA: Diagnosis not present

## 2024-08-10 DIAGNOSIS — Z124 Encounter for screening for malignant neoplasm of cervix: Secondary | ICD-10-CM | POA: Insufficient documentation

## 2024-08-10 DIAGNOSIS — J0100 Acute maxillary sinusitis, unspecified: Secondary | ICD-10-CM | POA: Insufficient documentation

## 2024-08-10 DIAGNOSIS — R1032 Left lower quadrant pain: Secondary | ICD-10-CM | POA: Insufficient documentation

## 2024-08-10 DIAGNOSIS — F332 Major depressive disorder, recurrent severe without psychotic features: Secondary | ICD-10-CM | POA: Diagnosis not present

## 2024-08-10 MED ORDER — AZITHROMYCIN 250 MG PO TABS: ORAL_TABLET | ORAL | 0 refills | Status: AC

## 2024-08-10 MED ORDER — GUAIFENESIN 200 MG PO TABS: 200.0000 mg | ORAL_TABLET | Freq: Three times a day (TID) | ORAL | 0 refills | Status: AC | PRN

## 2024-08-10 MED ORDER — AZELASTINE HCL 0.1 % NA SOLN: 1.0000 | Freq: Two times a day (BID) | NASAL | 5 refills | Status: AC

## 2024-08-10 MED ORDER — ESCITALOPRAM OXALATE 10 MG PO TABS: 10.0000 mg | ORAL_TABLET | Freq: Every day | ORAL | 0 refills | Status: AC

## 2024-08-10 MED ORDER — ESCITALOPRAM OXALATE 5 MG PO TABS: 5.0000 mg | ORAL_TABLET | Freq: Every day | ORAL | 0 refills | Status: AC

## 2024-08-10 NOTE — Progress Notes (Signed)
 "    Subjective:  Patient ID: [name], female    DOB: 19-Oct-1965, 59 y.o.   MRN: [id number]  Patient Care Team: Deitra Morton Sebastian Nena, NP as PCP - General (Nurse Practitioner) Patient, No Pcp Per (General Practice)   Chief Complaint:  Establish Care, Back Pain (Back ache started yesterdya. Knot in lower back. Shoulders really tight. Feels like her muscles catch when she moves certain ways. ), and sick (Day 9. Productive cough, headaches,body aches, congestion. Flu and covid test negativ. OTC not helping. Can't miss anymore work. )   HPI: [name] is a 59 y.o. female presenting on 08/10/2024 for Establish Care, Back Pain (Back ache started yesterdya. Knot in lower back. Shoulders really tight. Feels like her muscles catch when she moves certain ways. ), and sick (Day 9. Productive cough, headaches,body aches, congestion. Flu and covid test negativ. OTC not helping. Can't miss anymore work. )   Discussed the use of AI scribe software for clinical note transcription with the patient, who gave verbal consent to proceed.  History of Present Illness [name] is a 59 year old female who presents with a persistent cough and back pain.  She has been experiencing a persistent cough for nine days, which has not improved with over-the-counter cold medications. The cough is severe enough to interfere with her work, as she is unable to take phone calls without coughing. She wants medication to suppress the cough. She also experiences nasal congestion and produces brownish or green sputum when coughing. She feels that her recovery from the illness has plateaued and is concerned about the duration of her symptoms.  She reports significant back pain, which she suspects may be related to her persistent coughing. The pain is described as severe and radiates up into her shoulder. This pain began the day before the visit. 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 Cozetta. She has not taken any specific medication for the back pain, relying instead on over-the-counter cold medications.  She is currently taking Lexapro  for depression and anxiety, which was prescribed by a previous provider. She has experienced issues with medication errors in the past, including being prescribed the wrong medication, which led to fainting episodes. She is actively seeking a mental health provider and has been using her employee assistance program to find one. She has previously engaged in counseling but is currently not satisfied with her counselor. She is interested in finding a therapist, nutritional.  She has a history of taking Xanax  for panic and anxiety, which she has been on for 10 years. She uses it sparingly, with a current prescription of 0.5 mg tablets, which she estimates will last her four months. She also takes hydroxyzine as needed for queasiness and is on a drug holiday from Fosamax due to upcoming dental implants. She has not taken Fosamax for two months.  She works for [employer], takes inbound phone calls, and works three jobs. Her husband is disabled and she suspects he may have early dementia which increase her stress level.       08/10/2024    9:08 AM 08/07/2022    8:30 AM 10/15/2016   11:56 AM  PHQ9 SCORE ONLY  PHQ-9 Total Score 18 17  4       Data saved with a previous flowsheet row definition       08/10/2024    9:08 AM  GAD 7 : Generalized Anxiety Score  Nervous, Anxious, on Edge 2  Control/stop worrying 3  Worry too much - different things 3  Trouble relaxing 1  Restless 0  Easily annoyed or irritable 1  Afraid - awful might happen 1  Total GAD 7 Score 11  Anxiety Difficulty Very difficult      Relevant past medical, surgical, family, and social history reviewed and updated as indicated.  Allergies and medications reviewed and updated. Data reviewed: Chart in Epic.   Past Medical  History:  Diagnosis Date   Atypical mole 01/12/2015   Mid-Lower Abdomen (mild)   Bulging lumbar disc    Chronic lower back pain    Crohn's disease (HCC)    DDD (degenerative disc disease), lumbar    Dental crowns present    Hearing loss    left   Nasal bone fracture 04/2015   Seasonal allergies    Tension headache    TMJ syndrome    Wears hearing aid    left ear    Past Surgical History:  Procedure Laterality Date   ABDOMINAL HYSTERECTOMY  10/23/2005   partial   APPENDECTOMY     BREAST BIOPSY Right 03/14/2024   MM RT BREAST BX W LOC DEV 1ST LESION IMAGE BX SPEC STEREO GUIDE 03/14/2024 [facility]   BREAST ENHANCEMENT SURGERY  1998   BREAST EXCISIONAL BIOPSY Right    years ago   BUNIONECTOMY     CLOSED REDUCTION NASAL FRACTURE N/A 05/14/2015   Procedure: CLOSED REDUCTION NASAL FRACTURE;  Surgeon: Daniel Moccasin, MD;  Location: [facility];  Service: ENT;  Laterality: N/A;   FINGER SURGERY Right    middle finger - x 2 others besides ORIF   KNEE ARTHROSCOPY     ORIF FINGER FRACTURE Right    middle finger   ROBOTIC ASSISTED BILATERAL SALPINGO OOPHERECTOMY Bilateral 10/17/2015   Procedure: ROBOTIC ASSISTED BILATERAL SALPINGO OOPHORECTOMY;  Surgeon: Rosaline Luna, MD;  Location: [facility];  Service: Gynecology;  Laterality: Bilateral;   ROBOTIC ASSISTED LAPAROSCOPIC LYSIS OF ADHESION N/A 10/17/2015   Procedure: ROBOTIC ASSISTED LAPAROSCOPIC LYSIS OF ADHESION, CAUTERY OF ENDOMETRIOSIS;  Surgeon: Rosaline Luna, MD;  Location: [facility];  Service: Gynecology;  Laterality: N/A;   STAPEDECTOMY Left    TONSILLECTOMY     age 49    Social History   Socioeconomic History   Marital status: Married    Spouse name: Not on file   Number of children: Not on file   Years of education: Not on file   Highest education level: Not on file  Occupational History   Not on file  Tobacco Use   Smoking status: Former    Current packs/day: 0.00    Average packs/day: 2.0 packs/day     Types: Cigarettes    Quit date: 08/01/2008    Years since quitting: 16.0   Smokeless tobacco: Never   Tobacco comments:    she's had a few drags since quitting  Substance and Sexual Activity   Alcohol use: Yes    Comment: twice a year maybe   Drug use: No   Sexual activity: Yes    Birth control/protection: Surgical  Other Topics Concern   Not on file  Social History Narrative   ** Merged History Encounter **       Social Drivers of Health   Tobacco Use: Medium Risk (08/10/2024)   Patient History    Smoking Tobacco Use: Former    Smokeless Tobacco Use: Never  Passive Exposure: Not on file  Financial Resource Strain: Not on file  Food Insecurity: Low Risk (11/25/2022)   Received from [facility]   Epic    Within the past 12 months, you worried that your food would run out before you got money to buy more: Never true    Within the past 12 months, the food you bought just didn't last and you didn't have money to get more. : Never true  Transportation Needs: No Transportation Needs (11/25/2022)   Received from [facility]    In the past 12 months, has lack of reliable transportation kept you from medical appointments, meetings, work or from getting things needed for daily living? : No  Physical Activity: Not on file  Stress: Not on file  Social Connections: Not on file  Intimate Partner Violence: Not on file  Depression (PHQ2-9): High Risk (08/10/2024)   Depression (PHQ2-9)    PHQ-2 Score: 18  Alcohol Screen: Not on file  Housing: Low Risk (11/25/2022)   Received from [facility]   Epic    What is your living situation today?: I have a steady place to live    Think about the place you live. Do you have problems with any of the following? Choose all that apply:: None/None on this list  Utilities: Low Risk (11/25/2022)   Received from [facility]   Utilities    In the past 12 months has the electric, gas, oil, or water company threatened to shut off  services in your home? : No  Health Literacy: Not on file    Outpatient Encounter Medications as of 08/10/2024  Medication Sig   ALPRAZolam  (XANAX ) 0.25 MG tablet Take 0.25 mg by mouth daily as needed for anxiety.   ALPRAZolam  (XANAX ) 0.5 MG tablet Take 1 tablet (0.5 mg total) by mouth 3 (three) times daily as needed for anxiety.   Ascorbic Acid (VITAMIN C PO) Take 1 tablet by mouth daily.   azelastine  (ASTELIN ) 0.1 % nasal spray Place 1 spray into both nostrils 2 (two) times daily. Use in each nostril as directed   azithromycin  (ZITHROMAX ) 250 MG tablet Take 2 tablets on day 1, then 1 tablet daily on days 2 through 5   CALCIUM  PO Take 2 tablets by mouth daily.   Cholecalciferol (VITAMIN D3) 5000 units TABS Take 2 tablets by mouth daily.   escitalopram  (LEXAPRO ) 5 MG tablet Take 1 tablet (5 mg total) by mouth daily. Take with the 10 mg for a total of 15 mg   glucosamine-chondroitin 500-400 MG tablet Take 2 tablets by mouth at bedtime. With Tumeric and magnesium   guaiFENesin  200 MG tablet Take 1 tablet (200 mg total) by mouth every 8 (eight) hours as needed for cough or to loosen phlegm.   hydrOXYzine (VISTARIL) 25 MG capsule Take 25 mg by mouth 4 (four) times daily as needed.   OVER THE COUNTER MEDICATION Take 1 scoop by mouth 2 (two) times daily. monolaurin powder   oxycodone  (OXY-IR) 5 MG capsule Take 5 mg by mouth every 4 (four) hours as needed.   oxyCODONE -acetaminophen  (PERCOCET/ROXICET) 5-325 MG tablet Take 1 tablet by mouth 3 (three) times daily as needed.   Thiamine HCl (THIAMINE PO) Take 2 tablets by mouth daily.   TURMERIC PO Take 2 tablets by mouth daily.    alendronate (FOSAMAX) 70 MG tablet Take 70 mg by mouth once a week. (Patient not taking: Reported on 08/10/2024)   ALPRAZolam  (XANAX ) 0.5 MG tablet  Take 1 tablet (0.5 mg total) by mouth 3 (three) times daily as needed for anxiety.   CHOLINE PO Take 2 tablets by mouth daily.   escitalopram  (LEXAPRO ) 10 MG tablet Take 1 tablet  (10 mg total) by mouth daily. Take with the 5 mg for a total of 15 mg   HYDROcodone-acetaminophen  (NORCO/VICODIN) 5-325 MG tablet Take 1 tablet by mouth every 6 (six) hours as needed. (Patient not taking: Reported on 08/10/2024)   lisdexamfetamine (VYVANSE) 30 MG capsule Take 30 mg by mouth daily.   Omega-3 Fatty Acids (FISH OIL) 1000 MG CAPS Take 2 capsules by mouth daily.   [DISCONTINUED] Desvenlafaxine  Succinate ER (PRISTIQ ) 25 MG TB24 Take 25 mg by mouth daily.   [DISCONTINUED] escitalopram  (LEXAPRO ) 10 MG tablet Take 10 mg by mouth daily.   [DISCONTINUED] gabapentin  (NEURONTIN ) 300 MG capsule Take 2 capsules (600 mg total) by mouth 3 (three) times daily.   [DISCONTINUED] methocarbamol  (ROBAXIN ) 500 MG tablet Take 2 tablets (1,000 mg total) by mouth 3 (three) times daily as needed for muscle spasms. (Patient not taking: Reported on 08/10/2024)   [DISCONTINUED] tacrolimus  (PROTOPIC ) 0.1 % ointment Apply topically 2 (two) times daily. (Patient not taking: Reported on 08/10/2024)   [DISCONTINUED] varenicline  (CHANTIX  CONTINUING MONTH PAK) 1 MG tablet Take 1 tablet (1 mg total) by mouth 2 (two) times daily. (Patient not taking: Reported on 08/10/2024)   No facility-administered encounter medications on file as of 08/10/2024.    Allergies[1]  Pertinent ROS per HPI, otherwise unremarkable      Objective:  BP 110/65   Pulse 77   Temp 98 F (36.7 C)   Ht 5' 6 (1.676 m)   Wt 173 lb (78.5 kg)   SpO2 97%   BMI 27.92 kg/m    Wt Readings from Last 3 Encounters:  08/10/24 173 lb (78.5 kg)  09/04/23 191 lb (86.6 kg)  08/07/22 179 lb (81.2 kg)    Physical Exam Vitals and nursing note reviewed.  Constitutional:      General: She is not in acute distress. HENT:     Head: Normocephalic and atraumatic.     Right Ear: Ear canal and external ear normal. There is no impacted cerumen.     Left Ear: Tympanic membrane and external ear normal. There is no impacted cerumen.     Nose: Congestion  present.     Mouth/Throat:     Mouth: Mucous membranes are moist.  Eyes:     Extraocular Movements: Extraocular movements intact.     Conjunctiva/sclera: Conjunctivae normal.  Neck:     Vascular: No carotid bruit.  Cardiovascular:     Heart sounds: Normal heart sounds.  Pulmonary:     Effort: Pulmonary effort is normal.     Breath sounds: Normal breath sounds.  Abdominal:     General: Bowel sounds are normal.     Palpations: Abdomen is soft.  Musculoskeletal:        General: Normal range of motion.     Cervical back: Normal range of motion and neck supple. No rigidity or tenderness.     Right lower leg: No edema.     Left lower leg: No edema.  Lymphadenopathy:     Cervical: No cervical adenopathy.  Skin:    General: Skin is warm and dry.     Findings: No lesion or rash.  Neurological:     Mental Status: She is alert and oriented to person, place, and time.  Psychiatric:  Mood and Affect: Mood normal.        Behavior: Behavior normal.        Thought Content: Thought content normal.        Judgment: Judgment normal.    Physical Exam      Results for orders placed or performed during the hospital encounter of 03/14/24  Surgical pathology   Collection Time: 03/14/24 12:00 AM  Result Value Ref Range   SURGICAL PATHOLOGY      SURGICAL PATHOLOGY [facility] [address] [place], [place] [address] Telephone [phone] or [phone] Fax [phone]  REPORT OF SURGICAL PATHOLOGY   Accession #: [id number] Patient Name: [name], [name] Visit # : [id number]  MRN: [id number] Physician: Avanell Lenis DOB/Age [birth year] (Age: 61) Gender: F Collected Date: 03/14/2024 Received Date: 03/14/2024  FINAL DIAGNOSIS       1. Breast, right, needle core biopsy, upper outer quadrant, coil clip :       - BENIGN BREAST TISSUE WITH FIBROCYSTIC CHANGE, HYALINE FIBROSIS AND      CALCIFICATIONS      - NO ATYPIA OR MALIGNANCY  IDENTIFIED       DATE SIGNED OUT: 03/15/2024 ELECTRONIC SIGNATURE : Sharma M.D., Nupur, Pathologist, Electronic Signature  MICROSCOPIC DESCRIPTION  CASE COMMENTS STAINS USED IN DIAGNOSIS: H&E-2 H&E-3 H&E-2 H&E-3 H&E-4 H&E H&E-4 H&E H&E-2 H&E-3 H&E-4 H&E H&E-2 H&E-3 H&E-4 H&E    CLINICAL HISTORY  SPECIMEN(S) OBTAINED 1.  Breast, right, needle core biopsy, Upper Outer Quadrant, Coil Clip  SPECIMEN COMMENTS: 1. TIF: 9:39 AM, CIT 2 minutes; 2.4cm group of suspicious calcifications SPECIMEN CLINICAL INFORMATION: 1. Benign vs carcinoma    Gross Description 1. Received in formalin labeled with the patient's name and right breast calcs UOQ are multiple yellow-tan needle core biopsy fragments measuring 3.5 x 2.3 x 0.3 cm in aggregate.   The cores with microcalcifications are submitted in 1A-1C.  The cores without microcalcifications are submitted in 1D.      Time in formalin is 9:39 a.m. on 03/14/24. CIT 2 minutes.  (WC:kh 03/14/24)        Report signed out from the following location(s) [place] [address] [place], [place] [address] CLIA #: [id number]  [facility] [address] [place], [place] [address] CLIA #: [id number]        Pertinent labs & imaging results that were available during my care of the patient were reviewed by me and considered in my medical decision making.  Assessment & Plan:  [name] was seen today for establish care, back pain and sick.  Diagnoses and all orders for this visit:  Encounter for general adult medical examination with abnormal findings -     VITAMIN D  25 Hydroxy (Vit-D Deficiency, Fractures) -     CBC with Differential/Platelet -     CMP14+EGFR -     Lipid panel -     Thyroid  Panel With TSH -     Amb ref to Integrated Behavioral Health -     escitalopram  (LEXAPRO ) 5 MG tablet; Take 1 tablet (5 mg total) by mouth daily. Take with the 10 mg for a total of 15 mg -      escitalopram  (LEXAPRO ) 10 MG tablet; Take 1 tablet (10 mg total) by mouth daily. Take with the 5 mg for a total of 15 mg -     HepB+HepC+HIV Panel -     Ambulatory referral to Gastroenterology  Vitamin D   deficiency -     VITAMIN D  25 Hydroxy (Vit-D Deficiency, Fractures)  Severe episode of recurrent major depressive disorder, without psychotic features (HCC) -     escitalopram  (LEXAPRO ) 5 MG tablet; Take 1 tablet (5 mg total) by mouth daily. Take with the 10 mg for a total of 15 mg -     escitalopram  (LEXAPRO ) 10 MG tablet; Take 1 tablet (10 mg total) by mouth daily. Take with the 5 mg for a total of 15 mg -     Ambulatory referral to Psychiatry  Screening for colon cancer -     Ambulatory referral to Gastroenterology  Screening for cervical cancer -     Ambulatory referral to Obstetrics / Gynecology  Acute non-recurrent maxillary sinusitis -     azithromycin  (ZITHROMAX ) 250 MG tablet; Take 2 tablets on day 1, then 1 tablet daily on days 2 through 5  URI with cough and congestion -     guaiFENesin  200 MG tablet; Take 1 tablet (200 mg total) by mouth every 8 (eight) hours as needed for cough or to loosen phlegm. -     azelastine  (ASTELIN ) 0.1 % nasal spray; Place 1 spray into both nostrils 2 (two) times daily. Use in each nostril as directed  Diazepam  dependence (HCC) -     Ambulatory referral to Psychiatry  GAD (generalized anxiety disorder)     Assessment and Plan [name] is a 59 year old Caucasian female seen today for establish care, no acute distress Assessment & Plan Acute upper respiratory infection Persistent cough and nasal congestion with productive sputum. - Prescribed Z-Pak: two tablets today, then one daily for five days. - Prescribed guaifenesin : one tablet every eight hours as needed with water. - Prescribed Astelin  nasal spray: one puff each nostril three times daily.  Major depressive disorder and generalized anxiety disorder High depression score despite  Lexapro . Overwhelmed by stressors. Referred to counseling. - Increased Lexapro  to 15 mg daily. - Referred to counselor Re[facility] for in-person counseling. - Continue hydroxyzine as needed for queasiness. - Continue Xanax  as needed for panic and anxiety, with psychiatry referral if needed. Patient and/or legal guardian verbally consented to [facility] services about presenting concerns and psychiatric consultation as appropriate.  The services will be billed as appropriate for the patient   Vitamin D  deficiency Vitamin D  level needs assessment to ensure adequate supplementation. - Ordered vitamin D  level.  Osteoporosis On Fosamax drug holiday due to dental implants. Bones fragile. - Continue Fosamax drug holiday until post-dental surgery in March.  General Health Maintenance Up to date on vaccines. Due for Pap smear and colonoscopy. - Referred to GYN for Pap smear. - Referred to GI for colonoscopy, preference for [place] or [facility]. - Ordered CBC, CMP, lipid panel, TSH, and vitamin D  level.   Labs CBC, CMP, lipid, TSH, vitamin D  result pending  Continue all other maintenance medications.  Follow up plan: Return in about 6 months (around 02/07/2025) for Chronic Diseases Management.   Continue healthy lifestyle choices, including diet (rich in fruits, vegetables, and lean proteins, and low in salt and simple carbohydrates) and exercise (at least 30 minutes of moderate physical activity daily).  Educational handout given for   Clinical References  Sinus Infection, Adult A sinus infection is soreness and swelling (inflammation) of your sinuses. Sinuses are hollow spaces in the bones around your face. They are located: Around your eyes. In the middle of your forehead. Behind your nose. In your cheekbones. Your sinuses and nasal passages  are lined with a fluid called mucus. Mucus drains out of your sinuses. Swelling can trap mucus in your sinuses. This  lets germs (bacteria, virus, or fungus) grow, which leads to infection. Most of the time, this condition is caused by a virus. What are the causes? Allergies. Asthma. Germs. Things that block your nose or sinuses. Growths in the nose (nasal polyps). Chemicals or irritants in the air. A fungus. This is rare. What increases the risk? Having a weak body defense system (immune system). Doing a lot of swimming or diving. Using nasal sprays too much. Smoking. What are the signs or symptoms? The main symptoms of this condition are pain and a feeling of pressure around the sinuses. Other symptoms include: Stuffy nose (congestion). This may make it hard to breathe through your nose. Runny nose (drainage). Soreness, swelling, and warmth in the sinuses. A cough that may get worse at night. Being unable to smell and taste. Mucus that collects in the throat or the back of the nose (postnasal drip). This may cause a sore throat or bad breath. Being very tired (fatigued). A fever. How is this diagnosed? Your symptoms. Your medical history. A physical exam. Tests to find out if your condition is short-term (acute) or long-term (chronic). Your doctor may: Check your nose for growths (polyps). Check your sinuses using a tool that has a light on one end (endoscope). Check for allergies or germs. Do imaging tests, such as an MRI or CT scan. How is this treated? Treatment for this condition depends on the cause and whether it is short-term or long-term. If caused by a virus, your symptoms should go away on their own within 10 days. You may be given medicines to relieve symptoms. They include: Medicines that shrink swollen tissue in the nose. A spray that treats swelling of the nostrils. Rinses that help get rid of thick mucus in your nose (nasal saline washes). Medicines that treat allergies (antihistamines). Over-the-counter pain relievers. If caused by bacteria, your doctor may wait to see if  you will get better without treatment. You may be given antibiotic medicine if you have: A very bad infection. A weak body defense system. If caused by growths in the nose, surgery may be needed. Follow these instructions at home: Medicines Take, use, or apply over-the-counter and prescription medicines only as told by your doctor. These may include nasal sprays. If you were prescribed an antibiotic medicine, take it as told by your doctor. Do not stop taking it even if you start to feel better. Hydrate and humidify  Drink enough water to keep your pee (urine) pale yellow. Use a cool mist humidifier to keep the humidity level in your home above 50%. Breathe in steam for 10-15 minutes, 3-4 times a day, or as told by your doctor. You can do this in the bathroom while a hot shower is running. Try not to spend time in cool or dry air. Rest Rest as much as you can. Sleep with your head raised (elevated). Make sure you get enough sleep each night. General instructions  Put a warm, moist washcloth on your face 3-4 times a day, or as often as told by your doctor. Use nasal saline washes as often as told by your doctor. Wash your hands often with soap and water. If you cannot use soap and water, use hand sanitizer. Do not smoke. Avoid being around people who are smoking (secondhand smoke). Keep all follow-up visits. Contact a doctor if: You have a fever.  Your symptoms get worse. Your symptoms do not get better within 10 days. Get help right away if: You have a very bad headache. You cannot stop vomiting. You have very bad pain or swelling around your face or eyes. You have trouble seeing. You feel confused. Your neck is stiff. You have trouble breathing. These symptoms may be an emergency. Get help right away. Call 911. Do not wait to see if the symptoms will go away. Do not drive yourself to the hospital. Summary A sinus infection is swelling of your sinuses. Sinuses are hollow  spaces in the bones around your face. This condition is caused by tissues in your nose that become inflamed or swollen. This traps germs. These can lead to infection. If you were prescribed an antibiotic medicine, take it as told by your doctor. Do not stop taking it even if you start to feel better. Keep all follow-up visits. This information is not intended to replace advice given to you by your health care provider. Make sure you discuss any questions you have with your health care provider. Document Revised: 06/18/2021 Document Reviewed: 06/18/2021 Elsevier Patient Education  2024 [employer] Managing Anxiety, Adult After being diagnosed with anxiety, you may be relieved to know why you have felt or behaved a certain way. You may also feel overwhelmed about the treatment ahead and what it will mean for your life. With care and support, you can manage your anxiety. How to manage lifestyle changes Understanding the difference between stress and anxiety Although stress can play a role in anxiety, it is not the same as anxiety. Stress is your body's reaction to life changes and events, both good and bad. Stress is often caused by something external, such as a deadline, test, or competition. It normally goes away after the event has ended and will last just a few hours. But, stress can be ongoing and can lead to more than just stress. Anxiety is caused by something internal, such as imagining a terrible outcome or worrying that something will go wrong that will greatly upset you. Anxiety often does not go away even after the event is over, and it can become a long-term (chronic) worry. Lowering stress and anxiety Talk with your health care provider or a counselor to learn more about lowering anxiety and stress. They may suggest tension-reduction techniques, such as: Music. Spend time creating or listening to music that you enjoy and that inspires you. Mindfulness-based meditation. Practice being  aware of your normal breaths while not trying to control your breathing. It can be done while sitting or walking. Centering prayer. Focus on a word, phrase, or sacred image that means something to you and brings you peace. Deep breathing. Expand your stomach and inhale slowly through your nose. Hold your breath for 3-5 seconds. Then breathe out slowly, letting your stomach muscles relax. Self-talk. Learn to notice and spot thought patterns that lead to anxiety reactions. Change those patterns to thoughts that feel peaceful. Muscle relaxation. Take time to tense muscles and then relax them. Choose a tension-reduction technique that fits your lifestyle and personality. These techniques take time and practice. Set aside 5-15 minutes a day to do them. Specialized therapists can offer counseling and training in these techniques. The training to help with anxiety may be covered by some insurance plans. Other things you can do to manage stress and anxiety include: Keeping a stress diary. This can help you learn what triggers your reaction and then learn ways to manage your  response. Thinking about how you react to certain situations. You may not be able to control everything, but you can control your response. Making time for activities that help you relax and not feeling guilty about spending your time in this way. Doing visual imagery. This involves imagining or creating mental pictures to help you relax. Practicing yoga. Through yoga poses, you can lower tension and relax.   Medicines Medicines for anxiety include: Antidepressant medicines. These are usually prescribed for long-term daily control. Anti-anxiety medicines. These may be added in severe cases, especially when panic attacks occur. When used together, medicines, psychotherapy, and tension-reduction techniques may be the most effective treatment. Relationships Relationships can play a big part in helping you recover. Spend more time  connecting with trusted friends and family members. Think about going to couples counseling if you have a partner, taking family education classes, or going to family therapy. Therapy can help you and others better understand your anxiety. How to recognize changes in your anxiety Everyone responds differently to treatment for anxiety. Recovery from anxiety happens when symptoms lessen and stop interfering with your daily life at home or work. This may mean that you will start to: Have better concentration and focus. Worry will interfere less in your daily thinking. Sleep better. Be less irritable. Have more energy. Have improved memory. Try to recognize when your condition is getting worse. Contact your provider if your symptoms interfere with home or work and you feel like your condition is not improving. Follow these instructions at home: Activity Exercise. Adults should: Exercise for at least 150 minutes each week. The exercise should increase your heart rate and make you sweat (moderate-intensity exercise). Do strengthening exercises at least twice a week. Get the right amount and quality of sleep. Most adults need 7-9 hours of sleep each night. Lifestyle  Eat a healthy diet that includes plenty of vegetables, fruits, whole grains, low-fat dairy products, and lean protein. Do not eat a lot of foods that are high in fats, added sugars, or salt (sodium). Make choices that simplify your life. Do not use any products that contain nicotine or tobacco. These products include cigarettes, chewing tobacco, and vaping devices, such as e-cigarettes. If you need help quitting, ask your provider. Avoid caffeine, alcohol, and certain over-the-counter cold medicines. These may make you feel worse. Ask your pharmacist which medicines to avoid. General instructions Take over-the-counter and prescription medicines only as told by your provider. Keep all follow-up visits. This is to make sure you are  managing your anxiety well or if you need more support. Where to find support You can get help and support from: Self-help groups. Online and [employer]. A trusted spiritual leader. Couples counseling. Family education classes. Family therapy. Where to find more information You may find that joining a support group helps you deal with your anxiety. The following sources can help you find counselors or support groups near you: Mental Health America: mentalhealthamerica.net Anxiety and Depression Association of America (ADAA): adaa.org The [employer] on Mental Illness (NAMI): nami.org Contact a health care provider if: You have a hard time staying focused or finishing tasks. You spend many hours a day feeling worried about everyday life. You are very tired because you cannot stop worrying. You start to have headaches or often feel tense. You have chronic nausea or diarrhea. Get help right away if: Your heart feels like it is racing. You have shortness of breath. You have thoughts of hurting yourself or others. Get help right away  if you feel like you may hurt yourself or others, or have thoughts about taking your own life. Go to your nearest emergency room or: Call 911. Call the National Suicide Prevention Lifeline at 915-123-4203 or 988. This is open 24 hours a day. Text the Crisis Text Line at 218-095-0431. This information is not intended to replace advice given to you by your health care provider. Make sure you discuss any questions you have with your health care provider. Document Revised: 04/22/2022 Document Reviewed: 11/04/2020 Elsevier Patient Education  2024 [employer] Inc. Cancer Screening: Female A cancer screening is a test or exam that checks for cancer. Work with your health care provider to create a cancer screening schedule that protects your health. Who should have screening? All females should be considered for screening of certain cancers, including  breast cancer, cervical cancer, colorectal cancer, endometrial cancer, lung cancer, and skin cancer. Your health care provider may recommend screenings for other types of cancer if: You have had cancer before. You have a family member with cancer. You have genes that could increase the risk of cancer. You have risk factors for certain cancers, such as current or past use of tobacco products or being overweight. What are the benefits of screening? Cancer screening is done to look for cancer in the very early stages, before it spreads and becomes harder to treat and before you would start to notice symptoms. Finding cancer early improves the chances of successful treatment. It may save your life. When should I be screened for cancer? When you should be screened for cancer depends on: Your age. Your medical history and your family's medical history. Certain lifestyle factors, such as smoking or other use of tobacco products. Environmental exposure, such as to asbestos. How is screening done? Breast cancer Breast cancer screening is done with a test that takes images of breast tissue (mammogram) using an X-ray machine. Here are some screening guidelines for females at average risk: When you are 50-92 years old, you should be given the choice to start having mammograms. When you are 18-21 years old, you should have a mammogram every year. You may start having mammograms before you are 61 years old if you have risk factors for breast cancer, such as having an immediate family member with breast cancer. At 6 years old or older, you should have a mammogram every 1-2 years for as long as you are in good health and have a life expectancy of 10 years or longer. It is important to know what your breasts look and feel like so you can report any changes to your health care provider.   Cervical cancer Cervical cancer screening is done with an HPV (human papillomavirus) test to identify the virus that causes  cervical cancer. To perform the test, a health care provider takes a swab of cells from the lowest part of the uterus (cervix) during a pelvic exam. This test may be performed along with a Pap test. This testchecks for abnormalities in the cervix. All females at average risk should consider being screened for cervical cancer starting no later than 61 years old and continuing until 61 years old. Screening should not begin earlier than 61 years old. You will have tests every 3-5 years, depending on your results and the type of screening test. Talk with your health care provider about which screening test is right for you and how often you should be screened. If you have had the HPV vaccine, you will still be screened for  cervical cancer and follow normal screening recommendations. You do not need to be screened for cervical cancer if any of the following apply to you: You are older than 61 years old and you have had normal screening tests in the past 10 years with no serious cervical precancer or cancer in the last 25 years. Your cervix and uterus have been removed, and you have never had cervical cancer or abnormal cells that could become cancer (precancerous cells). Colorectal cancer Colorectal cancer screening looks for cancer or for growths called polyps that often form before cancer starts. Tests to look for cancer or polyps include: Colonoscopy or flexible sigmoidoscopy. For these procedures, a flexible tube with a small camera is inserted into the rectum. CT colonography. This test uses X-rays and contrast dye to check the colon for polyps. Tests to look for cancer in the stool (feces) include: Guaiac-based fecal occult blood test (FOBT). This test can find blood in stool. It can be done at home with a kit. Fecal immunochemical test (FIT). This test can find blood in stool. For this test, you will need to collect stool samples at home. Stool DNA test. This test looks for blood in stool and any  changes in DNA that can lead to colon cancer. For this test, you will need to collect a stool sample at home and send it to a lab. All adults should have screenings starting at 61 years old and continuing through 61 years old. For females 75-15 years old, the decision to be screened should be based on a person's preferences, life expectancy, overall health, and prior screening history. Your health care provider may recommend screening before 61 years old. You will have tests every 1-10 years, depending on your results and the type of screening test. People at increased risk should start screening at an earlier age. Talk with your health care provider about which screening test is right for you and how often you should be screened. Endometrial cancer There is no standard screening test for endometrial cancer, and females at average risk do not routinely need to have this screening. Talk with your health care provider about whether screening is right for you. If it is, this screening is performed through: Endometrial tissue biopsy. This tests a sample of tissue taken from the lining of the uterus. Vaginal ultrasound. If you are at increased risk for endometrial cancer, you may need to have these tests more often than normal. You are at increased risk if: You have a family history of ovarian, uterine, or certain types of colon cancer. You are taking tamoxifen, a medicine used to treat breast cancer. If you have reached menopause, it is especially important to talk with your health care provider about any vaginal bleeding or spotting. Screening for endometrial cancer is not recommended for females who do not have symptoms of the cancer, such as vaginal bleeding. Lung cancer Lung cancer screening is done with a CT scan that looks for abnormal changes in the lungs. Discuss lung cancer screening with your health care provider if you are 37-25 years old and if any of the following apply to you: You currently  smoke. You used to smoke heavily. You have a smoking history of 1 pack of cigarettes a day for 20 years or 2 packs a day for 10 years. You may need to be screened every year if you smoke heavily or if you used to smoke. Skin cancer Skin cancer screening is done by checking the skin for unusual  moles or spots and any changes in existing moles. Your health care provider should check your skin for signs of skin cancer at every physical exam. You should check your skin every month and tell your health care provider right away if anything looks unusual. Females with a higher-than-normal risk for skin cancer may want to see a skin specialist (dermatologist) for an annual body check. Where to find more information American Cancer Society: cancer.org Centers for Disease Control and Prevention: tonerpromos.no National Cancer Institute: cancer.gov U.S. Department of Health and Human Services: travellesson.ca Contact a health care provider if: You have concerns about any signs or symptoms of cancer. These may include: Skin problems. You may have: Moles of an unusual shape or color. Changes in existing moles. A sore on your skin that does not heal. Tiredness (fatigue) that does not go away. Losing weight without trying. Blood in your urine or stool. Problems with coughing or breathing. These may include: Coughing or trouble breathing that does not go away. Coughing up blood. Lumps or other changes in your breasts. Vaginal bleeding, spotting, or changes in your period. Frequent pain or cramping in your abdomen. This information is not intended to replace advice given to you by your health care provider. Make sure you discuss any questions you have with your health care provider. Document Revised: 07/22/2022 Document Reviewed: 02/03/2022 Elsevier Patient Education  2024 Elsevier Inc. Managing Depression, Adult Depression is a mental health condition that affects your thoughts, feelings, and actions.  Being diagnosed with depression can bring you relief if you did not know why you have felt or behaved a certain way. It could also leave you feeling overwhelmed. Finding ways to manage your symptoms can help you feel more positive about your future. How to manage lifestyle changes Being depressed is difficult. Depression can increase the level of everyday stress. Stress can make depression symptoms worse. You may believe your symptoms cannot be managed or will never improve. However, there are many things you can try to help manage your symptoms. There is hope. Managing stress  Stress is your body's reaction to life changes and events, both good and bad. Stress can add to your feelings of depression. Learning to manage your stress can help lessen your feelings of depression. Try some of the following approaches to reducing your stress (stress reduction techniques): Listen to music that you enjoy and that inspires you. Try using a meditation app or take a meditation class. Develop a practice that helps you connect with your spiritual self. Walk in nature, pray, or go to a place of worship. Practice deep breathing. To do this, inhale slowly through your nose. Pause at the top of your inhale for a few seconds and then exhale slowly, letting yourself relax. Repeat this three or four times. Practice yoga to help relax and work your muscles. Choose a stress reduction technique that works for you. These techniques take time and practice to develop. Set aside 5-15 minutes a day to do them. Therapists can offer training in these techniques. Do these things to help manage stress: Keep a journal. Know your limits. Set healthy boundaries for yourself and others, such as saying no when you think something is too much. Pay attention to how you react to certain situations. You may not be able to control everything, but you can change your reaction. Add humor to your life by watching funny movies or shows. Make  time for activities that you enjoy and that relax you. Spend less time  using electronics, especially at night before bed. The light from screens can make your brain think it is time to get up rather than go to bed.   Medicines Medicines, such as antidepressants, are often a part of treatment for depression. Talk with your pharmacist or health care provider about all the medicines, supplements, and herbal products that you take, their possible side effects, and what medicines and other products are safe to take together. Make sure to report any side effects you may have to your health care provider. Relationships Your health care provider may suggest family therapy, couples therapy, or individual therapy as part of your treatment. How to recognize changes Everyone responds differently to treatment for depression. As you recover from depression, you may start to: Have more interest in doing activities. Feel more hopeful. Have more energy. Eat a more regular amount of food. Have better mental focus. It is important to recognize if your depression is not getting better or is getting worse. The symptoms you had in the beginning may return, such as: Feeling tired. Eating too much or too little. Sleeping too much or too little. Feeling restless, agitated, or hopeless. Trouble focusing or making decisions. Having unexplained aches and pains. Feeling irritable, angry, or aggressive. If you or your family members notice these symptoms coming back, let your health care provider know right away. Follow these instructions at home: Activity Try to get some form of exercise each day, such as walking. Try yoga, mindfulness, or other stress reduction techniques. Participate in group activities if you are able. Lifestyle Get enough sleep. Cut down on or stop using caffeine, tobacco, alcohol, and any other harmful substances. Eat a healthy diet that includes plenty of vegetables, fruits, whole grains,  low-fat dairy products, and lean protein. Limit foods that are high in solid fats, added sugar, or salt (sodium). General instructions Take over-the-counter and prescription medicines only as told by your health care provider. Keep all follow-up visits. It is important for your health care provider to check on your mood, behavior, and medicines. Your health care provider may need to make changes to your treatment. Where to find support Talking to others  Friends and family members can be sources of support and guidance. Talk to trusted friends or family members about your condition. Explain your symptoms and let them know that you are working with a health care provider to treat your depression. Tell friends and family how they can help. Finances Find mental health providers that fit with your financial situation. Talk with your health care provider if you are worried about access to food, housing, or medicine. Call your insurance company to learn about your co-pays and prescription plan. Where to find more information You can find support in your area from: Anxiety and Depression Association of America (ADAA): adaa.org Mental Health America: mentalhealthamerica.net The [employer] on Mental Illness: nami.org Contact a health care provider if: You stop taking your antidepressant medicines, and you have any of these symptoms: Nausea. Headache. Light-headedness. Chills and body aches. Not being able to sleep (insomnia). You or your friends and family think your depression is getting worse. Get help right away if: You have thoughts of hurting yourself or others. Get help right away if you feel like you may hurt yourself or others, or have thoughts about taking your own life. Go to your nearest emergency room or: Call 911. Call the National Suicide Prevention Lifeline at 916-074-1661 or 988. This is open 24 hours a day. Text the Crisis  Text Line at 251-786-7973. This information is not  intended to replace advice given to you by your health care provider. Make sure you discuss any questions you have with your health care provider. Document Revised: 11/19/2021 Document Reviewed: 11/19/2021 Elsevier Patient Education  2024 Elsevier Inc.  The above assessment and management plan was discussed with the patient. The patient verbalized understanding of and has agreed to the management plan. Patient is aware to call the clinic if they develop any new symptoms or if symptoms persist or worsen. Patient is aware when to return to the clinic for a follow-up visit. Patient educated on when it is appropriate to go to the emergency department.   Nena Cassis Morton Hummer, DNP [facility] [address] [place], [place] [address] [phone]       [1]  Allergies Allergen Reactions   Other Other (See Comments)   Sulfa Antibiotics Itching   Sulfa Antibiotics Itching   "

## 2024-08-11 ENCOUNTER — Ambulatory Visit: Payer: Self-pay | Admitting: Nurse Practitioner

## 2024-08-11 DIAGNOSIS — E781 Pure hyperglyceridemia: Secondary | ICD-10-CM

## 2024-08-11 DIAGNOSIS — E785 Hyperlipidemia, unspecified: Secondary | ICD-10-CM

## 2024-08-11 LAB — CBC WITH DIFFERENTIAL/PLATELET
Basophils Absolute: 0.1 x10E3/uL (ref 0.0–0.2)
Basos: 1 %
EOS (ABSOLUTE): 0.4 x10E3/uL (ref 0.0–0.4)
Eos: 5 %
Hematocrit: 46.3 % (ref 34.0–46.6)
Hemoglobin: 14.6 g/dL (ref 11.1–15.9)
Immature Grans (Abs): 0 x10E3/uL (ref 0.0–0.1)
Immature Granulocytes: 0 %
Lymphocytes Absolute: 2.4 x10E3/uL (ref 0.7–3.1)
Lymphs: 28 %
MCH: 29.8 pg (ref 26.6–33.0)
MCHC: 31.5 g/dL (ref 31.5–35.7)
MCV: 95 fL (ref 79–97)
Monocytes Absolute: 0.7 x10E3/uL (ref 0.1–0.9)
Monocytes: 8 %
Neutrophils Absolute: 5.1 x10E3/uL (ref 1.4–7.0)
Neutrophils: 58 %
Platelets: 217 x10E3/uL (ref 150–450)
RBC: 4.9 x10E6/uL (ref 3.77–5.28)
RDW: 13.3 % (ref 11.7–15.4)
WBC: 8.8 x10E3/uL (ref 3.4–10.8)

## 2024-08-11 LAB — HEPB+HEPC+HIV PANEL
HIV Screen 4th Generation wRfx: NONREACTIVE
Hep B C IgM: NEGATIVE
Hep B Core Total Ab: NEGATIVE
Hep B E Ab: NONREACTIVE
Hep B E Ag: NEGATIVE
Hep B Surface Ab, Qual: REACTIVE
Hep C Virus Ab: NONREACTIVE
Hepatitis B Surface Ag: NEGATIVE

## 2024-08-11 LAB — CMP14+EGFR
ALT: 13 IU/L (ref 0–32)
AST: 18 IU/L (ref 0–40)
Albumin: 4.4 g/dL (ref 3.8–4.9)
Alkaline Phosphatase: 76 IU/L (ref 49–135)
BUN/Creatinine Ratio: 14 (ref 12–28)
BUN: 10 mg/dL (ref 8–27)
Bilirubin Total: 0.2 mg/dL (ref 0.0–1.2)
CO2: 27 mmol/L (ref 20–29)
Calcium: 9.6 mg/dL (ref 8.7–10.3)
Chloride: 97 mmol/L (ref 96–106)
Creatinine, Ser: 0.73 mg/dL (ref 0.57–1.00)
Globulin, Total: 2.5 g/dL (ref 1.5–4.5)
Glucose: 75 mg/dL (ref 70–99)
Potassium: 4.7 mmol/L (ref 3.5–5.2)
Sodium: 140 mmol/L (ref 134–144)
Total Protein: 6.9 g/dL (ref 6.0–8.5)
eGFR: 94 mL/min/1.73

## 2024-08-11 LAB — LIPID PANEL
Chol/HDL Ratio: 5.2 ratio — ABNORMAL HIGH (ref 0.0–4.4)
Cholesterol, Total: 218 mg/dL — ABNORMAL HIGH (ref 100–199)
HDL: 42 mg/dL
LDL Chol Calc (NIH): 93 mg/dL (ref 0–99)
Triglycerides: 505 mg/dL — ABNORMAL HIGH (ref 0–149)
VLDL Cholesterol Cal: 83 mg/dL — ABNORMAL HIGH (ref 5–40)

## 2024-08-11 LAB — VITAMIN D 25 HYDROXY (VIT D DEFICIENCY, FRACTURES): Vit D, 25-Hydroxy: 32.9 ng/mL (ref 30.0–100.0)

## 2024-08-11 LAB — THYROID PANEL WITH TSH
Free Thyroxine Index: 2.1 (ref 1.2–4.9)
T3 Uptake Ratio: 34 % (ref 24–39)
T4, Total: 6.2 ug/dL (ref 4.5–12.0)
TSH: 2.84 u[IU]/mL (ref 0.450–4.500)

## 2024-08-11 MED ORDER — OMEGA-3-ACID ETHYL ESTERS 1 G PO CAPS: 1.0000 g | ORAL_CAPSULE | Freq: Two times a day (BID) | ORAL | 1 refills | Status: AC

## 2024-08-11 MED ORDER — ATORVASTATIN CALCIUM 10 MG PO TABS: 10.0000 mg | ORAL_TABLET | Freq: Every day | ORAL | 0 refills | Status: AC

## 2024-08-15 ENCOUNTER — Encounter (INDEPENDENT_AMBULATORY_CARE_PROVIDER_SITE_OTHER): Payer: Self-pay | Admitting: *Deleted

## 2024-08-15 ENCOUNTER — Other Ambulatory Visit: Payer: Self-pay | Admitting: Nurse Practitioner

## 2024-08-15 DIAGNOSIS — E781 Pure hyperglyceridemia: Secondary | ICD-10-CM

## 2024-08-15 DIAGNOSIS — E785 Hyperlipidemia, unspecified: Secondary | ICD-10-CM

## 2025-01-26 ENCOUNTER — Ambulatory Visit (HOSPITAL_COMMUNITY): Payer: Self-pay | Admitting: Registered Nurse

## 2025-02-08 ENCOUNTER — Ambulatory Visit: Admitting: Family Medicine
